# Patient Record
Sex: Female | Born: 1975 | Race: Black or African American | Hispanic: No | Smoking: Current every day smoker
Health system: Southern US, Community
[De-identification: ages and names within clinical notes are randomized; demographics above are authoritative.]

## PROBLEM LIST (undated history)

## (undated) DIAGNOSIS — E119 Type 2 diabetes mellitus without complications: Secondary | ICD-10-CM

## (undated) DIAGNOSIS — I10 Essential (primary) hypertension: Secondary | ICD-10-CM

## (undated) DIAGNOSIS — R0600 Dyspnea, unspecified: Secondary | ICD-10-CM

## (undated) DIAGNOSIS — E669 Obesity, unspecified: Secondary | ICD-10-CM

## (undated) DIAGNOSIS — G4730 Sleep apnea, unspecified: Secondary | ICD-10-CM

## (undated) HISTORY — PX: CHOLECYSTECTOMY: SHX55

## (undated) HISTORY — PX: TUBAL LIGATION: SHX77

## (undated) HISTORY — PX: OTHER SURGICAL HISTORY: SHX169

---

## 2007-01-10 ENCOUNTER — Emergency Department (HOSPITAL_COMMUNITY): Admission: EM | Admit: 2007-01-10 | Discharge: 2007-01-10 | Payer: Self-pay | Admitting: Emergency Medicine

## 2011-04-14 LAB — DIFFERENTIAL
Eosinophils Absolute: 0.1
Eosinophils Relative: 1
Lymphs Abs: 2.6
Monocytes Absolute: 0.7
Monocytes Relative: 10

## 2011-04-14 LAB — CBC
HCT: 43.1
MCV: 89.5
RBC: 4.82
WBC: 7.1

## 2015-11-11 ENCOUNTER — Encounter (HOSPITAL_COMMUNITY): Payer: Self-pay | Admitting: Emergency Medicine

## 2015-11-11 ENCOUNTER — Emergency Department (HOSPITAL_COMMUNITY)
Admission: EM | Admit: 2015-11-11 | Discharge: 2015-11-11 | Disposition: A | Discharge status: Home / Self Care | Payer: Self-pay | Attending: Emergency Medicine | Admitting: Emergency Medicine

## 2015-11-11 DIAGNOSIS — F17210 Nicotine dependence, cigarettes, uncomplicated: Secondary | ICD-10-CM | POA: Insufficient documentation

## 2015-11-11 DIAGNOSIS — Z79899 Other long term (current) drug therapy: Secondary | ICD-10-CM | POA: Insufficient documentation

## 2015-11-11 DIAGNOSIS — R070 Pain in throat: Secondary | ICD-10-CM | POA: Insufficient documentation

## 2015-11-11 DIAGNOSIS — R059 Cough, unspecified: Secondary | ICD-10-CM

## 2015-11-11 DIAGNOSIS — R079 Chest pain, unspecified: Secondary | ICD-10-CM | POA: Insufficient documentation

## 2015-11-11 DIAGNOSIS — R05 Cough: Secondary | ICD-10-CM | POA: Insufficient documentation

## 2015-11-11 DIAGNOSIS — I10 Essential (primary) hypertension: Secondary | ICD-10-CM | POA: Insufficient documentation

## 2015-11-11 HISTORY — DX: Essential (primary) hypertension: I10

## 2015-11-11 MED ORDER — DEXAMETHASONE 4 MG PO TABS
10.0000 mg | ORAL_TABLET | Freq: Once | ORAL | Status: AC
  Administered 2015-11-11: 10 mg via ORAL
  Filled 2015-11-11: qty 3

## 2015-11-11 MED ORDER — ALBUTEROL SULFATE HFA 108 (90 BASE) MCG/ACT IN AERS
2.0000 | INHALATION_SPRAY | Freq: Once | RESPIRATORY_TRACT | Status: AC
  Administered 2015-11-11: 2 via RESPIRATORY_TRACT
  Filled 2015-11-11: qty 6.7

## 2015-11-11 MED ORDER — AEROCHAMBER PLUS W/MASK MISC
1.0000 | Freq: Once | Status: AC
  Administered 2015-11-11: 1
  Filled 2015-11-11: qty 1

## 2015-11-11 NOTE — ED Notes (Signed)
Pt departed in NAD.  

## 2015-11-11 NOTE — ED Provider Notes (Signed)
CSN: [id number]     Arrival date & time 11/11/15  0403 History   First MD Initiated Contact with Patient 11/11/15 0425     Chief Complaint  Patient presents with  . Cough  . Chest Pain     (Consider location/radiation/quality/duration/timing/severity/associated sxs/prior Treatment) Patient is a 40 y.o. female presenting with cough. The history is provided by the patient.  Cough Cough characteristics:  Dry Severity:  Moderate Onset quality:  Gradual Duration:  2 weeks Timing:  Constant Progression:  Worsening Chronicity:  New Smoker: yes   Context: not upper respiratory infection   Relieved by:  Nothing Worsened by:  Nothing tried Ineffective treatments: tessalon perles. Associated symptoms: chest pain (with cough)   Associated symptoms: no fever, no myalgias, no rash, no shortness of breath and no wheezing     Past Medical History  Diagnosis Date  . Hypertension    Past Surgical History  Procedure Laterality Date  . Cholecystectomy     No family history on file. Social History  Substance Use Topics  . Smoking status: Current Every Day Smoker -- 1.00 packs/day for 9 years    Types: Cigarettes  . Smokeless tobacco: Never Used  . Alcohol Use: No   OB History    No data available     Review of Systems  Constitutional: Negative for fever.  Respiratory: Positive for cough. Negative for shortness of breath and wheezing.   Cardiovascular: Positive for chest pain (with cough).  Musculoskeletal: Negative for myalgias.  Skin: Negative for rash.  All other systems reviewed and are negative.     Allergies  Review of patient's allergies indicates no known allergies.  Home Medications   Prior to Admission medications   Medication Sig Start Date End Date Taking? Authorizing Provider  hydrochlorothiazide (HYDRODIURIL) 25 MG tablet Take 25 mg by mouth daily.   Yes Historical Provider, MD  lisinopril (PRINIVIL,ZESTRIL) 20 MG tablet Take 20 mg by mouth daily.   Yes  Historical Provider, MD  metoprolol succinate (TOPROL-XL) 25 MG 24 hr tablet Take 25 mg by mouth daily.   Yes Historical Provider, MD   BP 157/91 mmHg  Pulse 95  Temp(Src) 98.1 F (36.7 C) (Oral)  Resp 17  Ht 5\' 5"  (1.651 m)  Wt 269 lb (122.018 kg)  BMI 44.76 kg/m2  SpO2 100%  LMP 11/11/2015 (Exact Date) Physical Exam  Constitutional: She is oriented to person, place, and time. She appears well-developed and well-nourished. No distress.  HENT:  Head: Normocephalic.  Eyes: Conjunctivae are normal.  Neck: Neck supple. No tracheal deviation present.  Cardiovascular: Normal rate, regular rhythm and normal heart sounds.  Exam reveals no friction rub.   No murmur heard. Pulmonary/Chest: Effort normal. No respiratory distress. She has no wheezes. She has no rales. She exhibits no tenderness.  Abdominal: Soft. Bowel sounds are normal. She exhibits no distension.  Neurological: She is alert and oriented to person, place, and time.  Skin: Skin is warm and dry.  Psychiatric: She has a normal mood and affect.  Vitals reviewed.   ED Course  Procedures (including critical care time) Labs Review Labs Reviewed - No data to display  Imaging Review No results found. I have personally reviewed and evaluated these images and lab results as part of my medical decision-making.   EKG Interpretation   Date/Time:  Monday Nov 11 2015 04:13:15 EDT Ventricular Rate:  93 PR Interval:  155 QRS Duration: 77 QT Interval:  384 QTC Calculation: 478 R Axis:  28 Text Interpretation:  Sinus rhythm Ventricular premature complex Probable  left atrial enlargement No previous tracing Confirmed by Sandra Tellefsen MD, Jameshia Hayashida  ([id number]) on 11/11/2015 4:54:59 AM      MDM   Final diagnoses:  Cough   40 y.o. female presents with Ongoing cough for the last 1-2 weeks. The cough is dry and is causing her to now have some throat pain. No fevers, no other infectious symptoms and low suspicion of pneumonia given her  clinical picture. It is possible she has a post viral cough syndrome that is exacerbated by her ongoing smoking. I also discussed the possibility of lisinopril induced cough and I recommended supportive care measures and if cough does not improve in the next 2-3 weeks she discuss discontinuing this medication with her primary care physician. Provided albuterol for adjunctive therapy and Decadron for throat pain. Plan to follow up with PCP as needed and return precautions discussed for worsening or new concerning symptoms.     Lyndal Pulley, MD 11/11/15 [phone]

## 2015-11-11 NOTE — ED Notes (Signed)
Pt had 3 teeth pulled 3 days ago. Has been experiencing a persistent dry cough ever since, with no relief from OTC cough meds. Reports CP whenever she coughs and a sore throat beginning today 5/14.

## 2015-11-11 NOTE — Discharge Instructions (Signed)
Cough, Adult Coughing is a reflex that clears your throat and your airways. Coughing helps to heal and protect your lungs. It is normal to cough occasionally, but a cough that happens with other symptoms or lasts a long time may be a sign of a condition that needs treatment. A cough may last only 2-3 weeks (acute), or it may last longer than 8 weeks (chronic). CAUSES Coughing is commonly caused by:  Breathing in substances that irritate your lungs.  A viral or bacterial respiratory infection.  Allergies.  Asthma.  Postnasal drip.  Smoking.  Acid backing up from the stomach into the esophagus (gastroesophageal reflux).  Certain medicines.  Chronic lung problems, including COPD (or rarely, lung cancer).  Other medical conditions such as heart failure. HOME CARE INSTRUCTIONS  Pay attention to any changes in your symptoms. Take these actions to help with your discomfort:  Take medicines only as told by your health care provider.  If you were prescribed an antibiotic medicine, take it as told by your health care provider. Do not stop taking the antibiotic even if you start to feel better.  Talk with your health care provider before you take a cough suppressant medicine.  Drink enough fluid to keep your urine clear or pale yellow.  If the air is dry, use a cold steam vaporizer or humidifier in your bedroom or your home to help loosen secretions.  Avoid anything that causes you to cough at work or at home.  If your cough is worse at night, try sleeping in a semi-upright position.  Avoid cigarette smoke. If you smoke, quit smoking. If you need help quitting, ask your health care provider.  Avoid caffeine.  Avoid alcohol.  Rest as needed. SEEK MEDICAL CARE IF:   You have new symptoms.  You cough up pus.  Your cough does not get better after 2-3 weeks, or your cough gets worse.  You cannot control your cough with suppressant medicines and you are losing sleep.  You  develop pain that is getting worse or pain that is not controlled with pain medicines.  You have a fever.  You have unexplained weight loss.  You have night sweats. SEEK IMMEDIATE MEDICAL CARE IF:  You cough up blood.  You have difficulty breathing.  Your heartbeat is very fast.   This information is not intended to replace advice given to you by your health care provider. Make sure you discuss any questions you have with your health care provider.   Document Released: 12/12/2010 Document Revised: 03/06/2015 Document Reviewed: 08/22/2014 Elsevier Interactive Patient Education 2016 [employer].  [employer] Vaporizers Vaporizers may help relieve the symptoms of a cough and cold. They add moisture to the air, which helps mucus to become thinner and less sticky. This makes it easier to breathe and cough up secretions. Cool mist vaporizers do not cause serious burns like hot mist vaporizers, which may also be called steamers or humidifiers. Vaporizers have not been proven to help with colds. You should not use a vaporizer if you are allergic to mold. HOME CARE INSTRUCTIONS  Follow the package instructions for the vaporizer.  Do not use anything other than distilled water in the vaporizer.  Do not run the vaporizer all of the time. This can cause mold or bacteria to grow in the vaporizer.  Clean the vaporizer after each time it is used.  Clean and dry the vaporizer well before storing it.  Stop using the vaporizer if worsening respiratory symptoms develop.  This information is not intended to replace advice given to you by your health care provider. Make sure you discuss any questions you have with your health care provider.   Document Released: 03/12/2004 Document Revised: 06/20/2013 Document Reviewed: 11/02/2012 Elsevier Interactive Patient Education [employer]2016 Elsevier Inc.

## 2016-03-03 DIAGNOSIS — M6702 Short Achilles tendon (acquired), left ankle: Secondary | ICD-10-CM

## 2016-03-03 DIAGNOSIS — M6701 Short Achilles tendon (acquired), right ankle: Secondary | ICD-10-CM

## 2016-03-03 HISTORY — DX: Short Achilles tendon (acquired), right ankle: M67.01

## 2016-06-28 ENCOUNTER — Encounter (HOSPITAL_COMMUNITY): Payer: Self-pay | Admitting: *Deleted

## 2016-06-28 ENCOUNTER — Emergency Department (HOSPITAL_COMMUNITY)
Admission: EM | Admit: 2016-06-28 | Discharge: 2016-06-28 | Disposition: A | Discharge status: Home / Self Care | Payer: PRIVATE HEALTH INSURANCE | Attending: Emergency Medicine | Admitting: Emergency Medicine

## 2016-06-28 ENCOUNTER — Emergency Department (HOSPITAL_COMMUNITY): Payer: PRIVATE HEALTH INSURANCE

## 2016-06-28 DIAGNOSIS — F17210 Nicotine dependence, cigarettes, uncomplicated: Secondary | ICD-10-CM | POA: Diagnosis not present

## 2016-06-28 DIAGNOSIS — Z7982 Long term (current) use of aspirin: Secondary | ICD-10-CM | POA: Insufficient documentation

## 2016-06-28 DIAGNOSIS — M5410 Radiculopathy, site unspecified: Secondary | ICD-10-CM | POA: Insufficient documentation

## 2016-06-28 DIAGNOSIS — M79604 Pain in right leg: Secondary | ICD-10-CM

## 2016-06-28 DIAGNOSIS — Z79899 Other long term (current) drug therapy: Secondary | ICD-10-CM | POA: Insufficient documentation

## 2016-06-28 DIAGNOSIS — I10 Essential (primary) hypertension: Secondary | ICD-10-CM | POA: Diagnosis not present

## 2016-06-28 DIAGNOSIS — E119 Type 2 diabetes mellitus without complications: Secondary | ICD-10-CM | POA: Diagnosis not present

## 2016-06-28 DIAGNOSIS — Z91040 Latex allergy status: Secondary | ICD-10-CM | POA: Diagnosis not present

## 2016-06-28 HISTORY — DX: Obesity, unspecified: E66.9

## 2016-06-28 HISTORY — DX: Type 2 diabetes mellitus without complications: E11.9

## 2016-06-28 LAB — CBG MONITORING, ED: GLUCOSE-CAPILLARY: 101 mg/dL — AB (ref 65–99)

## 2016-06-28 MED ORDER — DEXAMETHASONE SODIUM PHOSPHATE 10 MG/ML IJ SOLN: 10.0000 mg | Freq: Once | INTRAMUSCULAR | Status: DC

## 2016-06-28 MED ORDER — IBUPROFEN 400 MG PO TABS
600.0000 mg | ORAL_TABLET | Freq: Once | ORAL | Status: AC
  Administered 2016-06-28: 600 mg via ORAL
  Filled 2016-06-28: qty 1

## 2016-06-28 MED ORDER — METHOCARBAMOL 500 MG PO TABS: 500.0000 mg | ORAL_TABLET | Freq: Every evening | ORAL | 0 refills | Status: DC | PRN

## 2016-06-28 MED ORDER — GABAPENTIN 300 MG PO CAPS
300.0000 mg | ORAL_CAPSULE | Freq: Once | ORAL | Status: AC
  Administered 2016-06-28: 300 mg via ORAL
  Filled 2016-06-28: qty 1

## 2016-06-28 MED ORDER — HYDROCODONE-ACETAMINOPHEN 5-325 MG PO TABS
2.0000 | ORAL_TABLET | ORAL | 0 refills | Status: DC | PRN
Start: 2016-06-28 — End: 2016-10-22

## 2016-06-28 MED ORDER — GABAPENTIN 300 MG PO CAPS: 300.0000 mg | ORAL_CAPSULE | Freq: Three times a day (TID) | ORAL | 0 refills | Status: AC

## 2016-06-28 MED ORDER — HYDROCODONE-ACETAMINOPHEN 5-325 MG PO TABS
1.0000 | ORAL_TABLET | Freq: Once | ORAL | Status: AC
  Administered 2016-06-28: 1 via ORAL
  Filled 2016-06-28: qty 1

## 2016-06-28 MED ORDER — GABAPENTIN 600 MG PO TABS
300.0000 mg | ORAL_TABLET | Freq: Once | ORAL | Status: DC
  Filled 2016-06-28: qty 0.5

## 2016-06-28 MED ORDER — LORAZEPAM 1 MG PO TABS
1.0000 mg | ORAL_TABLET | Freq: Once | ORAL | Status: AC
  Administered 2016-06-28: 1 mg via ORAL
  Filled 2016-06-28: qty 1

## 2016-06-28 MED ORDER — HYDROMORPHONE HCL 2 MG/ML IJ SOLN
1.0000 mg | Freq: Once | INTRAMUSCULAR | Status: AC
  Administered 2016-06-28: 1 mg via INTRAMUSCULAR
  Filled 2016-06-28: qty 1

## 2016-06-28 MED ORDER — METHOCARBAMOL 500 MG PO TABS
1000.0000 mg | ORAL_TABLET | Freq: Once | ORAL | Status: AC
  Administered 2016-06-28: 1000 mg via ORAL
  Filled 2016-06-28: qty 2

## 2016-06-28 NOTE — ED Notes (Signed)
Call received from MRI staff reporting patient is having an anxiety attack and unable to finish exam.  Provider notified.  Relayed to caller that patient was just medicated and needed to wait a little longer to try the exam.

## 2016-06-28 NOTE — ED Notes (Signed)
Unable to do MRI due to body habitus.  Provider notified.

## 2016-06-28 NOTE — ED Triage Notes (Signed)
Pt reports severe pain down back of right leg. Pt went to [facility] last night, had xrays done and was told it was probably due to sciatica. No relief with ibuprofen and heating pad. Pt is tearful, reports unable to sleep or rest. [facility] told her to return if pain did not improve for possible MRI.

## 2016-06-28 NOTE — ED Provider Notes (Signed)
[facility]-EMERGENCY DEPT Provider Note   CSN: [id number] Arrival date & time: 06/28/16  1414     History   Chief Complaint Chief Complaint  Patient presents with  . Leg Pain    HPI Jean CaddyLaytwuan Kot is a 40 y.o. female who presents with right leg pain. PMH significant for non insulin dependent DM, HTN and obesity. Husband is at bedside. She states that she has been having this pain intermittently for the past 4 months. Usually it is just in the thigh and she would attribute it to walking a lot. For the past three days she has been having constant, gradually worsening, severe, shooting pain from the posterior mid thigh to the mid calf. Sometimes the pain is dull/achy and then can be burning or sharp. It is worse with movement and certain positions. Better with lying still. She denies back pain. Denies syncope, trauma, unexplained weight loss, hx of cancer, loss of bowel/bladder function, saddle anesthesia, urinary retention, IVDU. She is ambulatory with a limp. She states she they went to [facility] last night and had Xrays which showed degenerative disc disease. She was diagnosed with sciatica and given Meloxicam and flexaril and steroids. She was told she may need a MRI is symptoms are worsening. They tried to go back to [facility] this morning since pain was not getting better with medicines however the wait was too long so they came to [facility].     HPI  Past Medical History:  Diagnosis Date  . Diabetes mellitus without complication (HCC)   . Hypertension   . Obesity     There are no active problems to display for this patient.   Past Surgical History:  Procedure Laterality Date  . CHOLECYSTECTOMY      OB History    No data available       Home Medications    Prior to Admission medications   Medication Sig Start Date End Date Taking? Authorizing Provider  acetaminophen (TYLENOL) 500 MG tablet Take 1,000 mg by mouth every 6 (six) hours as needed.   Yes Historical  Provider, MD  aspirin 81 MG chewable tablet Chew 81 mg by mouth daily.   Yes Historical Provider, MD  calcium carbonate (OS-CAL - DOSED IN MG OF ELEMENTAL CALCIUM) 1250 (500 Ca) MG tablet Take 1 tablet by mouth daily with breakfast.   Yes Historical Provider, MD  Cholecalciferol (VITAMIN D3) 50000 units CAPS Take 50,000 Units by mouth daily.   Yes Historical Provider, MD  cyclobenzaprine (FLEXERIL) 10 MG tablet Take 10 mg by mouth 3 (three) times daily as needed for muscle spasms.   Yes Historical Provider, MD  Dulaglutide (TRULICITY) 0.75 MG/0.5ML SOPN Inject 0.75 mg into the skin once a week.   Yes Historical Provider, MD  ibuprofen (ADVIL,MOTRIN) 200 MG tablet Take 400 mg by mouth every 6 (six) hours as needed for moderate pain.   Yes Historical Provider, MD  metoprolol succinate (TOPROL-XL) 25 MG 24 hr tablet Take 25 mg by mouth daily.   Yes Historical Provider, MD  Olmesartan-Amlodipine-HCTZ (TRIBENZOR PO) Take 1 tablet by mouth daily.   Yes Historical Provider, MD  potassium chloride SA (K-DUR,KLOR-CON) 20 MEQ tablet Take 20 mEq by mouth daily.   Yes Historical Provider, MD    Family History History reviewed. No pertinent family history.  Social History Social History  Substance Use Topics  . Smoking status: Current Every Day Smoker    Packs/day: 1.00    Years: 9.00    Types: Cigarettes  .  Smokeless tobacco: Never Used  . Alcohol use No     Allergies   Latex   Review of Systems Review of Systems  Constitutional: Negative for fever.  Genitourinary: Negative for difficulty urinating.  Musculoskeletal: Positive for gait problem and myalgias. Negative for arthralgias and back pain.  Skin: Negative for color change and rash.  Allergic/Immunologic: Positive for immunocompromised state (diabetic).  Neurological: Positive for numbness. Negative for weakness.  All other systems reviewed and are negative.    Physical Exam Updated Vital Signs BP (!) 197/126 (BP Location: Right  Arm) Comment: patient states she hasn't taken her bp meds today  Pulse 99   Temp 98.2 F (36.8 C) (Oral)   Resp 16   Ht 5\' 5"  (1.651 m)   Wt 127.9 kg   LMP 06/02/2016   SpO2 98%   BMI 46.93 kg/m   Physical Exam  Constitutional: She is oriented to person, place, and time. She appears well-developed and well-nourished. She appears distressed.  Obese, lying on side, in mild distress due to pain  HENT:  Head: Normocephalic and atraumatic.  Eyes: Conjunctivae are normal. Pupils are equal, round, and reactive to light. Right eye exhibits no discharge. Left eye exhibits no discharge. No scleral icterus.  Neck: Normal range of motion.  Cardiovascular: Normal rate.   Pulmonary/Chest: Effort normal. No respiratory distress.  Abdominal: She exhibits no distension.  Musculoskeletal:  Back: Inspection: No masses, deformity, or rash Palpation: No midline spinal tenderness. No paraspinal muscle tenderness. Symptoms are reproduced with palpation along the right buttocks and posterior thigh. ROM: Deferred Strength: 5/5 in lower extremities and normal plantar and dorsiflexion Sensation: Intact sensation with light touch in lower extremities bilaterally Gait: Antalgic gait Reflexes: Patellar and Achilles reflex is 2+ on the left, and 1+ on the right.   SLR: Negative seated straight leg raise   Neurological: She is alert and oriented to person, place, and time.  Skin: Skin is warm and dry.  Psychiatric: She has a normal mood and affect. Her behavior is normal.  Nursing note and vitals reviewed.    ED Treatments / Results  Labs (all labs ordered are listed, but only abnormal results are displayed) Labs Reviewed - No data to display  EKG  EKG Interpretation None       Radiology No results found.  Procedures Procedures (including critical care time)  Medications Ordered in ED Medications  ibuprofen (ADVIL,MOTRIN) tablet 600 mg (600 mg Oral Given 06/28/16 1640)  methocarbamol  (ROBAXIN) tablet 1,000 mg (1,000 mg Oral Given 06/28/16 1641)  HYDROcodone-acetaminophen (NORCO/VICODIN) 5-325 MG per tablet 1 tablet (1 tablet Oral Given 06/28/16 1640)  gabapentin (NEURONTIN) capsule 300 mg (300 mg Oral Given 06/28/16 1640)  LORazepam (ATIVAN) tablet 1 mg (1 mg Oral Given 06/28/16 1940)  HYDROmorphone (DILAUDID) injection 1 mg (1 mg Intramuscular Given 06/28/16 1940)     Initial Impression / Assessment and Plan / ED Course  I have reviewed the triage vital signs and the nursing notes.  Pertinent labs & imaging results that were available during my care of the patient were reviewed by me and considered in my medical decision making (see chart for details).  Clinical Course    40 year old female with sciatica likely due to piriformis syndrome. She is markedly hypertensive but otherwise vitals are normal. Pain meds ordered.  On recheck, patient states pain has not improved. Will order MRI to r/o emergent process.  On 2nd recheck, patient states pain has improved to 4/10. Unfortunately pt  is too big to have MRI. BP is still elevated however improved after pain management. Discussed she will likely need MRI but can follow up with neurosurgery. No groin numbness, bowel/bladder incontinence, and she is ambulatory. No back pain. Discussed reasons to return to the ED. Patient is NAD, non-toxic, with stable VS. Patient is informed of clinical course, understands medical decision making process, and agrees with plan. Opportunity for questions provided and all questions answered. Return precautions given.   Final Clinical Impressions(s) / ED Diagnoses   Final diagnoses:  Right leg pain  Radiculopathy of leg    New Prescriptions Discharge Medication List as of 06/28/2016  9:53 PM    START taking these medications   Details  gabapentin (NEURONTIN) 300 MG capsule Take 1 capsule (300 mg total) by mouth 3 (three) times daily., Starting Sun 06/28/2016, Print      HYDROcodone-acetaminophen (NORCO/VICODIN) 5-325 MG tablet Take 2 tablets by mouth every 4 (four) hours as needed., Starting Sun 06/28/2016, Print    methocarbamol (ROBAXIN) 500 MG tablet Take 1 tablet (500 mg total) by mouth at bedtime and may repeat dose one time if needed., Starting Sun 06/28/2016, Print         Bethel Born, PA-C 06/28/16 2350    Marily Memos, MD 06/29/16 1213

## 2016-06-28 NOTE — Discharge Instructions (Signed)
Take anti-inflammatory medicines (Meloxicam) for the next week. Take this medicine with food. Take Gabapentin for nerve pain Take muscle relaxer at bedtime to help you sleep. This medicine makes you drowsy so do not take before driving or work Use a heating pad for sore muscles - use for 20 minutes several times a day

## 2016-10-05 ENCOUNTER — Other Ambulatory Visit: Payer: Self-pay | Admitting: Neurosurgery

## 2016-10-20 ENCOUNTER — Encounter (HOSPITAL_COMMUNITY)
Admission: RE | Admit: 2016-10-20 | Discharge: 2016-10-20 | Disposition: A | Discharge status: Home / Self Care | Payer: Medicaid Other | Source: Ambulatory Visit | Attending: Neurosurgery | Admitting: Neurosurgery

## 2016-10-20 ENCOUNTER — Encounter (HOSPITAL_COMMUNITY): Payer: Self-pay

## 2016-10-20 DIAGNOSIS — Z01810 Encounter for preprocedural cardiovascular examination: Secondary | ICD-10-CM | POA: Insufficient documentation

## 2016-10-20 DIAGNOSIS — Z01812 Encounter for preprocedural laboratory examination: Secondary | ICD-10-CM | POA: Insufficient documentation

## 2016-10-20 DIAGNOSIS — Z01818 Encounter for other preprocedural examination: Secondary | ICD-10-CM | POA: Diagnosis present

## 2016-10-20 DIAGNOSIS — R9431 Abnormal electrocardiogram [ECG] [EKG]: Secondary | ICD-10-CM | POA: Insufficient documentation

## 2016-10-20 DIAGNOSIS — E119 Type 2 diabetes mellitus without complications: Secondary | ICD-10-CM | POA: Diagnosis not present

## 2016-10-20 DIAGNOSIS — Z3202 Encounter for pregnancy test, result negative: Secondary | ICD-10-CM | POA: Diagnosis not present

## 2016-10-20 HISTORY — DX: Sleep apnea, unspecified: G47.30

## 2016-10-20 HISTORY — DX: Dyspnea, unspecified: R06.00

## 2016-10-20 LAB — CBC
HCT: 42.6 % (ref 36.0–46.0)
Hemoglobin: 14.6 g/dL (ref 12.0–15.0)
MCH: 30.9 pg (ref 26.0–34.0)
MCHC: 34.3 g/dL (ref 30.0–36.0)
MCV: 90.1 fL (ref 78.0–100.0)
PLATELETS: 268 10*3/uL (ref 150–400)
RBC: 4.73 MIL/uL (ref 3.87–5.11)
RDW: 13.2 % (ref 11.5–15.5)
WBC: 8.1 10*3/uL (ref 4.0–10.5)

## 2016-10-20 LAB — BASIC METABOLIC PANEL
ANION GAP: 10 (ref 5–15)
BUN: 13 mg/dL (ref 6–20)
CALCIUM: 9.3 mg/dL (ref 8.9–10.3)
CO2: 26 mmol/L (ref 22–32)
Chloride: 101 mmol/L (ref 101–111)
Creatinine, Ser: 0.63 mg/dL (ref 0.44–1.00)
Glucose, Bld: 106 mg/dL — ABNORMAL HIGH (ref 65–99)
Potassium: 3.6 mmol/L (ref 3.5–5.1)
SODIUM: 137 mmol/L (ref 135–145)

## 2016-10-20 LAB — SURGICAL PCR SCREEN
MRSA, PCR: NEGATIVE
STAPHYLOCOCCUS AUREUS: NEGATIVE

## 2016-10-20 LAB — GLUCOSE, CAPILLARY: Glucose-Capillary: 96 mg/dL (ref 65–99)

## 2016-10-20 LAB — HCG, SERUM, QUALITATIVE: Preg, Serum: NEGATIVE

## 2016-10-20 NOTE — Progress Notes (Addendum)
Anesthesia Consult:  Pt is a 41 year old female scheduled for L5-S1 microdiscectomy on 10/22/2016 with Jean Henderson, M.D.  [facility] includes: HTN, DM, OSA (not on CPAP yet). Current smoker. BMI 49.  - PCP is Jean Fickle, MD.   Medications include: Amlodipine-valsartan, ASA 81 mg, exenatide, metformin, metoprolol, potassium  BP 126/90   Pulse (!) 120 Comment: notified Jean Henderson  Temp 36.7 C   Resp 20   Ht  (1.651 m)   Wt 295 lb 9.6 oz (134.1 kg)   LMP 09/10/2016   SpO2 97%   BMI 49.19 kg/m    Preoperative labs reviewed.  Glucose 106. HbA1c pending.   EKG 10/20/16: Sinus tachycardia (114 bpm). Possible LA enlargement. LVH. Inferior infarct, age undetermined  Called to see pt for tachycardia.  She denies feeling heart racing or palpitations.  Denies CP, fatigue, dizziness, LE edema. Does c/o SOB with exertion since back/leg pain began 4 months ago and she became inactive; now gets SOB with walking, never at rest.  On exam, lungs CTA B and heart rhythm regular, tachycardic.  Pt reports she was out of BP meds due to lack of insurance for ~4 months and just recently restarted them.  Also recently dx with DM; fasting glucose 90-130.  Pt reports she was tachycardic at 2 recent PCP visits in last couple of weeks, but no one commented on it to her.    I will reach out to PCP regarding EKG findings, tachycardia.   Jean Henderson, Jean Henderson [facility]/Anesthesiology Phone: [phone] 10/20/2016 4:07 PM  Addendum: Dr. Samuel Germany reviewed Jean Henderson's anesthesia note and patient's EKG. He responded, "See no contraindications to surg and gen anes." (Copy on chart.) By records, she is back on medications (including DM meds and b-blocker). Patient will get vitals on arrival. A1c came back at 6.8.   Jean Henderson [facility] Short Stay Center/Anesthesiology Phone [phone] 10/21/2016 11:51 AM

## 2016-10-20 NOTE — Progress Notes (Signed)
   10/20/16 1336  OBSTRUCTIVE SLEEP APNEA  Have you ever been diagnosed with sleep apnea through a sleep study? No (to see pulmoary dr in [place] 10/26/16)  Do you snore loudly (loud enough to be heard through closed doors)?  1  Do you often feel tired, fatigued, or sleepy during the daytime (such as falling asleep during driving or talking to someone)? 0  Has anyone observed you stop breathing during your sleep? 1  Do you have, or are you being treated for high blood pressure? 1  BMI more than 35 kg/m2? 1  Age > 50 (1-yes) 0  Neck circumference greater than:Female 16 inches or larger, Female 17inches or larger? 1 (16.5)  Female Gender (Yes=1) 0  Obstructive Sleep Apnea Score 5  Score 5 or greater  Results sent to PCP

## 2016-10-20 NOTE — Pre-Procedure Instructions (Addendum)
Jean Henderson  10/20/2016      [facility] - [place], [place] - [address] AT [facility] [address] [place] [address] Phone: [phone] Fax: [phone]  [facility] [place], [place] - [address] [place] [address] Phone: [phone] Fax: [phone]    Your procedure is scheduled on 10/22/16.  Report to [facility] Admitting at 530 A.M.  Call this number if you have problems the morning of surgery:  [phone]   Remember:  Do not eat food or drink liquids after midnight.  Take these medicines the morning of surgery with A SIP OF WATER   Gabapentin,metoprolol,venalfaxine, tylenol if needed    STOP all herbel meds, nsaids (aleve,naproxen,advil,ibuprofen) prior to surgery starting Today including all vitamins/supplements,aspirin  No metformin  ,How to Manage Your Diabetes Before and After Surgery  Why is it important to control my blood sugar before and after surgery? . Improving blood sugar levels before and after surgery helps healing and can limit problems. . A way of improving blood sugar control is eating a healthy diet by: o  Eating less sugar and carbohydrates o  Increasing activity/exercise o  Talking with your doctor about reaching your blood sugar goals . High blood sugars (greater than 180 mg/dL) can raise your risk of infections and slow your recovery, so you will need to focus on controlling your diabetes during the weeks before surgery. . Make sure that the doctor who takes care of your diabetes knows about your planned surgery including the date and location.  How do I manage my blood sugar before surgery? . Check your blood sugar at least 4 times a day, starting 2 days before surgery, to make sure that the level is not too high or low. o Check your blood sugar the morning of your surgery when you wake up and every 2 hours until you get to the Short Stay unit. . If  your blood sugar is less than 70 mg/dL, you will need to treat for low blood sugar: o Do not take insulin. o Treat a low blood sugar (less than 70 mg/dL) with  cup of clear juice (cranberry or apple), 4 glucose tablets, OR glucose gel. o Recheck blood sugar in 15 minutes after treatment (to make sure it is greater than 70 mg/dL). If your blood sugar is not greater than 70 mg/dL on recheck, call [phone] for further instructions. . Report your blood sugar to the short stay nurse when you get to Short Stay.  . If you are admitted to the hospital after surgery: o Your blood sugar will be checked by the staff and you will probably be given insulin after surgery (instead of oral diabetes medicines) to make sure you have good blood sugar levels. o The goal for blood sugar control after surgery is 80-180 mg/dL.   WHAT DO I DO ABOUT MY DIABETES MEDICATION?   Marland Kitchen Do not take oral diabetes medicines (pills) the morning of surgery(.no metformin)     Do not wear jewelry, make-up or nail polish.  Do not wear lotions, powders, or perfumes, or deoderant.  Do not shave 48 hours prior to surgery.  Men may shave face and neck.  Do not bring valuables to the hospital.  [facility] is not responsible for any belongings or valuables.  Contacts, dentures or bridgework may not be worn into surgery.  Leave your suitcase in the  car.  After surgery it may be brought to your room.  For patients admitted to the hospital, discharge time will be determined by your treatment team.  Patients discharged the day of surgery will not be allowed to drive home.   Special instructions:   Special Instructions: [facility] - Preparing for Surgery  Before surgery, you can play an important role.  Because skin is not sterile, your skin needs to be as free of germs as possible.  You can reduce the number of germs on you skin by washing with CHG (chlorahexidine gluconate) soap before surgery.  CHG is an antiseptic cleaner  which kills germs and bonds with the skin to continue killing germs even after washing.  Please DO NOT use if you have an allergy to CHG or antibacterial soaps.  If your skin becomes reddened/irritated stop using the CHG and inform your nurse when you arrive at Short Stay.  Do not shave (including legs and underarms) for at least 48 hours prior to the first CHG shower.  You may shave your face.  Please follow these instructions carefully:   1.  Shower with CHG Soap the night before surgery and the morning of Surgery.  2.  If you choose to wash your hair, wash your hair first as usual with your normal shampoo.  3.  After you shampoo, rinse your hair and body thoroughly to remove the Shampoo.  4.  Use CHG as you would any other liquid soap.  You can apply chg directly  to the skin and wash gently with scrungie or a clean washcloth.  5.  Apply the CHG Soap to your body ONLY FROM THE NECK DOWN.  Do not use on open wounds or open sores.  Avoid contact with your eyes ears, mouth and genitals (private parts).  Wash genitals (private parts)       with your normal soap.  6.  Wash thoroughly, paying special attention to the area where your surgery will be performed.  7.  Thoroughly rinse your body with warm water from the neck down.  8.  DO NOT shower/wash with your normal soap after using and rinsing off the CHG Soap.  9.  Pat yourself dry with a clean towel.            10.  Wear clean pajamas.            11.  Place clean sheets on your bed the night of your first shower and do not sleep with pets.  Day of Surgery  Do not apply any lotions/deodorants the morning of surgery.  Please wear clean clothes to the hospital/surgery center.  Please read over the fact sheets that you were given.

## 2016-10-20 NOTE — Progress Notes (Signed)
   10/20/16 1336  OBSTRUCTIVE SLEEP APNEA  Have you ever been diagnosed with sleep apnea through a sleep study? No (to see pulmoary dr in [place] 10/16/16)  Do you snore loudly (loud enough to be heard through closed doors)?  1  Do you often feel tired, fatigued, or sleepy during the daytime (such as falling asleep during driving or talking to someone)? 0  Has anyone observed you stop breathing during your sleep? 1  Do you have, or are you being treated for high blood pressure? 1  BMI more than 35 kg/m2? 1  Age > 50 (1-yes) 0  Neck circumference greater than:Female 16 inches or larger, Female 17inches or larger? 1 (16.5)  Female Gender (Yes=1) 0  Obstructive Sleep Apnea Score 5  Score 5 or greater  Results sent to PCP

## 2016-10-20 NOTE — Progress Notes (Addendum)
Called Jean Henderson diabetic coordinator,patient newly on diabetic meds,, has not had any education diabetic wise.she stated dhe would try to see patint after surgery. Consulted dr Rica Mast  About hr ,new diabetic, back on other meds for 2 weeks after none since jan 18 due to finances. Om medicaid now. req'd office notes from pcp.-on chart.

## 2016-10-21 LAB — HEMOGLOBIN A1C
HEMOGLOBIN A1C: 6.8 % — AB (ref 4.8–5.6)
MEAN PLASMA GLUCOSE: 148 mg/dL

## 2016-10-21 MED ORDER — DEXTROSE 5 % IV SOLN
3.0000 g | INTRAVENOUS | Status: AC
  Administered 2016-10-22: 3 g via INTRAVENOUS
  Filled 2016-10-21: qty 3000

## 2016-10-21 MED ORDER — DEXAMETHASONE SODIUM PHOSPHATE 10 MG/ML IJ SOLN
10.0000 mg | INTRAMUSCULAR | Status: AC
  Administered 2016-10-22: 10 mg via INTRAVENOUS
  Filled 2016-10-21: qty 1

## 2016-10-22 ENCOUNTER — Ambulatory Visit (HOSPITAL_COMMUNITY): Payer: Medicaid Other

## 2016-10-22 ENCOUNTER — Encounter (HOSPITAL_COMMUNITY)
Admission: RE | Disposition: A | Discharge status: Home / Self Care | Payer: Self-pay | Source: Ambulatory Visit | Attending: Neurosurgery

## 2016-10-22 ENCOUNTER — Ambulatory Visit (HOSPITAL_COMMUNITY): Payer: Medicaid Other | Admitting: Emergency Medicine

## 2016-10-22 ENCOUNTER — Encounter (HOSPITAL_COMMUNITY): Payer: Self-pay

## 2016-10-22 ENCOUNTER — Ambulatory Visit (HOSPITAL_COMMUNITY): Payer: Medicaid Other | Admitting: Certified Registered Nurse Anesthetist

## 2016-10-22 ENCOUNTER — Ambulatory Visit (HOSPITAL_COMMUNITY)
Admission: RE | Admit: 2016-10-22 | Discharge: 2016-10-22 | Disposition: A | Discharge status: Home / Self Care | Payer: Medicaid Other | Source: Ambulatory Visit | Attending: Neurosurgery | Admitting: Neurosurgery

## 2016-10-22 DIAGNOSIS — M5126 Other intervertebral disc displacement, lumbar region: Secondary | ICD-10-CM | POA: Diagnosis present

## 2016-10-22 DIAGNOSIS — Z7982 Long term (current) use of aspirin: Secondary | ICD-10-CM | POA: Diagnosis not present

## 2016-10-22 DIAGNOSIS — E119 Type 2 diabetes mellitus without complications: Secondary | ICD-10-CM | POA: Diagnosis not present

## 2016-10-22 DIAGNOSIS — Z7984 Long term (current) use of oral hypoglycemic drugs: Secondary | ICD-10-CM | POA: Insufficient documentation

## 2016-10-22 DIAGNOSIS — Z79899 Other long term (current) drug therapy: Secondary | ICD-10-CM | POA: Insufficient documentation

## 2016-10-22 DIAGNOSIS — F17210 Nicotine dependence, cigarettes, uncomplicated: Secondary | ICD-10-CM | POA: Insufficient documentation

## 2016-10-22 DIAGNOSIS — G4730 Sleep apnea, unspecified: Secondary | ICD-10-CM | POA: Diagnosis not present

## 2016-10-22 DIAGNOSIS — I10 Essential (primary) hypertension: Secondary | ICD-10-CM | POA: Insufficient documentation

## 2016-10-22 DIAGNOSIS — M5117 Intervertebral disc disorders with radiculopathy, lumbosacral region: Secondary | ICD-10-CM | POA: Diagnosis not present

## 2016-10-22 DIAGNOSIS — M549 Dorsalgia, unspecified: Secondary | ICD-10-CM | POA: Diagnosis present

## 2016-10-22 DIAGNOSIS — Z6841 Body Mass Index (BMI) 40.0 and over, adult: Secondary | ICD-10-CM | POA: Diagnosis not present

## 2016-10-22 DIAGNOSIS — Z419 Encounter for procedure for purposes other than remedying health state, unspecified: Secondary | ICD-10-CM

## 2016-10-22 HISTORY — PX: LUMBAR LAMINECTOMY/DECOMPRESSION MICRODISCECTOMY: SHX5026

## 2016-10-22 HISTORY — DX: Other intervertebral disc displacement, lumbar region: M51.26

## 2016-10-22 LAB — GLUCOSE, CAPILLARY
Glucose-Capillary: 114 mg/dL — ABNORMAL HIGH (ref 65–99)
Glucose-Capillary: 98 mg/dL (ref 65–99)

## 2016-10-22 SURGERY — LUMBAR LAMINECTOMY/DECOMPRESSION MICRODISCECTOMY 1 LEVEL
Anesthesia: General | Site: Spine Lumbar | Laterality: Right

## 2016-10-22 MED ORDER — VENLAFAXINE HCL 75 MG PO TABS: 75.0000 mg | ORAL_TABLET | Freq: Every day | ORAL | Status: DC

## 2016-10-22 MED ORDER — HYDROMORPHONE HCL 1 MG/ML IJ SOLN
INTRAMUSCULAR | Status: AC
  Administered 2016-10-22: 0.5 mg via INTRAVENOUS
  Filled 2016-10-22: qty 1

## 2016-10-22 MED ORDER — CYCLOBENZAPRINE HCL 10 MG PO TABS: 10.0000 mg | ORAL_TABLET | Freq: Three times a day (TID) | ORAL | 0 refills | Status: DC | PRN

## 2016-10-22 MED ORDER — METOPROLOL SUCCINATE ER 25 MG PO TB24: 50.0000 mg | ORAL_TABLET | Freq: Every day | ORAL | Status: DC

## 2016-10-22 MED ORDER — LORATADINE 10 MG PO TABS: 10.0000 mg | ORAL_TABLET | Freq: Every day | ORAL | Status: DC

## 2016-10-22 MED ORDER — 0.9 % SODIUM CHLORIDE (POUR BTL) OPTIME
TOPICAL | Status: DC | PRN
Start: 2016-10-22 — End: 2016-10-22
  Administered 2016-10-22: 1000 mL

## 2016-10-22 MED ORDER — ACETAMINOPHEN 325 MG PO TABS
ORAL_TABLET | ORAL | Status: AC
  Filled 2016-10-22: qty 2

## 2016-10-22 MED ORDER — ROCURONIUM BROMIDE 100 MG/10ML IV SOLN
INTRAVENOUS | Status: DC | PRN
  Administered 2016-10-22: 50 mg via INTRAVENOUS

## 2016-10-22 MED ORDER — MIDAZOLAM HCL 5 MG/5ML IJ SOLN
INTRAMUSCULAR | Status: DC | PRN
  Administered 2016-10-22: 2 mg via INTRAVENOUS

## 2016-10-22 MED ORDER — METFORMIN HCL 500 MG PO TABS: 500.0000 mg | ORAL_TABLET | Freq: Two times a day (BID) | ORAL | Status: DC

## 2016-10-22 MED ORDER — LACTATED RINGERS IV SOLN
INTRAVENOUS | Status: DC | PRN
  Administered 2016-10-22 (×2): via INTRAVENOUS

## 2016-10-22 MED ORDER — GLYCOPYRROLATE 0.2 MG/ML IJ SOLN
INTRAMUSCULAR | Status: DC | PRN
  Administered 2016-10-22: .6 mg via INTRAVENOUS

## 2016-10-22 MED ORDER — BUPIVACAINE HCL (PF) 0.25 % IJ SOLN
INTRAMUSCULAR | Status: DC | PRN
  Administered 2016-10-22: 10 mL

## 2016-10-22 MED ORDER — ACETAMINOPHEN 500 MG PO TABS: 1000.0000 mg | ORAL_TABLET | Freq: Four times a day (QID) | ORAL | Status: DC | PRN

## 2016-10-22 MED ORDER — HYDROMORPHONE HCL 1 MG/ML IJ SOLN: 0.5000 mg | INTRAMUSCULAR | Status: DC | PRN

## 2016-10-22 MED ORDER — METFORMIN HCL ER 500 MG PO TB24: 500.0000 mg | ORAL_TABLET | Freq: Two times a day (BID) | ORAL | Status: DC

## 2016-10-22 MED ORDER — CHLORHEXIDINE GLUCONATE CLOTH 2 % EX PADS: 6.0000 | MEDICATED_PAD | Freq: Once | CUTANEOUS | Status: DC

## 2016-10-22 MED ORDER — OXYCODONE HCL 5 MG PO TABS: 5.0000 mg | ORAL_TABLET | ORAL | 0 refills | Status: AC | PRN

## 2016-10-22 MED ORDER — OXYCODONE HCL 5 MG PO TABS
ORAL_TABLET | ORAL | Status: AC
  Filled 2016-10-22: qty 2

## 2016-10-22 MED ORDER — THROMBIN 5000 UNITS EX SOLR
CUTANEOUS | Status: AC
  Filled 2016-10-22: qty 10000

## 2016-10-22 MED ORDER — SODIUM CHLORIDE 0.9% FLUSH: 3.0000 mL | INTRAVENOUS | Status: DC | PRN

## 2016-10-22 MED ORDER — CYCLOBENZAPRINE HCL 10 MG PO TABS: 10.0000 mg | ORAL_TABLET | Freq: Three times a day (TID) | ORAL | Status: DC | PRN

## 2016-10-22 MED ORDER — PROMETHAZINE HCL 25 MG/ML IJ SOLN: 6.2500 mg | INTRAMUSCULAR | Status: DC | PRN

## 2016-10-22 MED ORDER — ONDANSETRON HCL 4 MG/2ML IJ SOLN: 4.0000 mg | Freq: Four times a day (QID) | INTRAMUSCULAR | Status: DC | PRN

## 2016-10-22 MED ORDER — OXYCODONE HCL 5 MG PO TABS
5.0000 mg | ORAL_TABLET | ORAL | Status: DC | PRN
  Administered 2016-10-22 (×2): 10 mg via ORAL
  Filled 2016-10-22: qty 2

## 2016-10-22 MED ORDER — ALBUMIN HUMAN 5 % IV SOLN
INTRAVENOUS | Status: DC | PRN
  Administered 2016-10-22: 09:00:00 via INTRAVENOUS

## 2016-10-22 MED ORDER — CEFAZOLIN SODIUM-DEXTROSE 2-4 GM/100ML-% IV SOLN: 2.0000 g | Freq: Three times a day (TID) | INTRAVENOUS | Status: DC

## 2016-10-22 MED ORDER — CYCLOBENZAPRINE HCL 10 MG PO TABS
10.0000 mg | ORAL_TABLET | Freq: Three times a day (TID) | ORAL | Status: DC | PRN
  Administered 2016-10-22: 10 mg via ORAL
  Filled 2016-10-22: qty 1

## 2016-10-22 MED ORDER — EXENATIDE ER 2 MG ~~LOC~~ PEN: 2.0000 mg | PEN_INJECTOR | SUBCUTANEOUS | Status: DC

## 2016-10-22 MED ORDER — OXYCODONE HCL 5 MG/5ML PO SOLN: 5.0000 mg | Freq: Once | ORAL | Status: DC | PRN

## 2016-10-22 MED ORDER — VITAMIN B-12 1000 MCG PO TABS
2000.0000 ug | ORAL_TABLET | Freq: Every day | ORAL | Status: DC
  Filled 2016-10-22: qty 2

## 2016-10-22 MED ORDER — MENTHOL 3 MG MT LOZG
1.0000 | LOZENGE | OROMUCOSAL | Status: DC | PRN
Start: 2016-10-22 — End: 2016-10-22

## 2016-10-22 MED ORDER — GABAPENTIN 300 MG PO CAPS
600.0000 mg | ORAL_CAPSULE | Freq: Three times a day (TID) | ORAL | Status: DC
  Administered 2016-10-22: 600 mg via ORAL
  Filled 2016-10-22: qty 2

## 2016-10-22 MED ORDER — IBUPROFEN 200 MG PO TABS: 800.0000 mg | ORAL_TABLET | Freq: Four times a day (QID) | ORAL | Status: DC | PRN

## 2016-10-22 MED ORDER — HYDROMORPHONE HCL 1 MG/ML IJ SOLN
0.2500 mg | INTRAMUSCULAR | Status: DC | PRN
  Administered 2016-10-22 (×4): 0.5 mg via INTRAVENOUS

## 2016-10-22 MED ORDER — ONDANSETRON HCL 4 MG/2ML IJ SOLN
INTRAMUSCULAR | Status: DC | PRN
  Administered 2016-10-22: 4 mg via INTRAVENOUS

## 2016-10-22 MED ORDER — PROPOFOL 10 MG/ML IV BOLUS
INTRAVENOUS | Status: AC
  Filled 2016-10-22: qty 40

## 2016-10-22 MED ORDER — FENTANYL CITRATE (PF) 250 MCG/5ML IJ SOLN
INTRAMUSCULAR | Status: AC
  Filled 2016-10-22: qty 5

## 2016-10-22 MED ORDER — THROMBIN 5000 UNITS EX SOLR
CUTANEOUS | Status: DC | PRN
Start: 2016-10-22 — End: 2016-10-22
  Administered 2016-10-22 (×2): 5000 [IU] via TOPICAL

## 2016-10-22 MED ORDER — MIDAZOLAM HCL 2 MG/2ML IJ SOLN
INTRAMUSCULAR | Status: AC
  Filled 2016-10-22: qty 2

## 2016-10-22 MED ORDER — PHENYLEPHRINE HCL 10 MG/ML IJ SOLN
INTRAMUSCULAR | Status: DC | PRN
  Administered 2016-10-22: 25 ug/min via INTRAVENOUS

## 2016-10-22 MED ORDER — PROPOFOL 10 MG/ML IV BOLUS
INTRAVENOUS | Status: DC | PRN
  Administered 2016-10-22: 200 mg via INTRAVENOUS

## 2016-10-22 MED ORDER — PHENOL 1.4 % MT LIQD: 1.0000 | OROMUCOSAL | Status: DC | PRN

## 2016-10-22 MED ORDER — PHENYLEPHRINE HCL 10 MG/ML IJ SOLN
INTRAMUSCULAR | Status: DC | PRN
  Administered 2016-10-22 (×4): 80 ug via INTRAVENOUS

## 2016-10-22 MED ORDER — MEPERIDINE HCL 25 MG/ML IJ SOLN: 6.2500 mg | INTRAMUSCULAR | Status: DC | PRN

## 2016-10-22 MED ORDER — BUPIVACAINE HCL (PF) 0.25 % IJ SOLN
INTRAMUSCULAR | Status: AC
Start: 2016-10-22 — End: ?
  Filled 2016-10-22: qty 30

## 2016-10-22 MED ORDER — ACETAMINOPHEN 325 MG PO TABS
650.0000 mg | ORAL_TABLET | ORAL | Status: DC | PRN
  Administered 2016-10-22: 650 mg via ORAL

## 2016-10-22 MED ORDER — PANTOPRAZOLE SODIUM 40 MG IV SOLR: 40.0000 mg | Freq: Every day | INTRAVENOUS | Status: DC

## 2016-10-22 MED ORDER — EPHEDRINE SULFATE 50 MG/ML IJ SOLN
INTRAMUSCULAR | Status: DC | PRN
  Administered 2016-10-22: 10 mg via INTRAVENOUS

## 2016-10-22 MED ORDER — ARTIFICIAL TEARS OPHTHALMIC OINT
TOPICAL_OINTMENT | OPHTHALMIC | Status: DC | PRN
  Administered 2016-10-22: 1 via OPHTHALMIC

## 2016-10-22 MED ORDER — ASPIRIN 81 MG PO CHEW: 81.0000 mg | CHEWABLE_TABLET | Freq: Every day | ORAL | Status: DC

## 2016-10-22 MED ORDER — VITAMIN D (ERGOCALCIFEROL) 1.25 MG (50000 UNIT) PO CAPS: 50000.0000 [IU] | ORAL_CAPSULE | ORAL | Status: DC

## 2016-10-22 MED ORDER — AMLODIPINE BESYLATE-VALSARTAN 10-320 MG PO TABS: 1.0000 | ORAL_TABLET | Freq: Every day | ORAL | Status: DC

## 2016-10-22 MED ORDER — AMLODIPINE BESYLATE 10 MG PO TABS
10.0000 mg | ORAL_TABLET | Freq: Every day | ORAL | Status: DC
  Filled 2016-10-22: qty 1

## 2016-10-22 MED ORDER — CALCIUM CARBONATE 1250 (500 CA) MG PO TABS
1.0000 | ORAL_TABLET | Freq: Every day | ORAL | Status: DC
  Filled 2016-10-22 (×2): qty 1

## 2016-10-22 MED ORDER — FENTANYL CITRATE (PF) 100 MCG/2ML IJ SOLN
INTRAMUSCULAR | Status: DC | PRN
  Administered 2016-10-22 (×3): 50 ug via INTRAVENOUS
  Administered 2016-10-22: 25 ug via INTRAVENOUS

## 2016-10-22 MED ORDER — LIDOCAINE HCL (CARDIAC) 20 MG/ML IV SOLN
INTRAVENOUS | Status: DC | PRN
  Administered 2016-10-22: 100 mg via INTRAVENOUS

## 2016-10-22 MED ORDER — IRBESARTAN 300 MG PO TABS
300.0000 mg | ORAL_TABLET | Freq: Every day | ORAL | Status: DC
  Filled 2016-10-22: qty 1

## 2016-10-22 MED ORDER — LIDOCAINE-EPINEPHRINE 1 %-1:100000 IJ SOLN
INTRAMUSCULAR | Status: AC
  Filled 2016-10-22: qty 1

## 2016-10-22 MED ORDER — ACETAMINOPHEN 650 MG RE SUPP: 650.0000 mg | RECTAL | Status: DC | PRN

## 2016-10-22 MED ORDER — ONDANSETRON HCL 4 MG PO TABS: 4.0000 mg | ORAL_TABLET | Freq: Four times a day (QID) | ORAL | Status: DC | PRN

## 2016-10-22 MED ORDER — LIDOCAINE-EPINEPHRINE 1 %-1:100000 IJ SOLN
INTRAMUSCULAR | Status: DC | PRN
  Administered 2016-10-22: 10 mL

## 2016-10-22 MED ORDER — ALUM & MAG HYDROXIDE-SIMETH 200-200-20 MG/5ML PO SUSP: 30.0000 mL | Freq: Four times a day (QID) | ORAL | Status: DC | PRN

## 2016-10-22 MED ORDER — POTASSIUM CHLORIDE CRYS ER 10 MEQ PO TBCR: 10.0000 meq | EXTENDED_RELEASE_TABLET | Freq: Every day | ORAL | Status: DC

## 2016-10-22 MED ORDER — SODIUM CHLORIDE 0.9% FLUSH: 3.0000 mL | Freq: Two times a day (BID) | INTRAVENOUS | Status: DC

## 2016-10-22 MED ORDER — BACITRACIN 50000 UNITS IM SOLR
INTRAMUSCULAR | Status: DC | PRN
  Administered 2016-10-22: 08:00:00

## 2016-10-22 MED ORDER — OXYCODONE HCL 5 MG PO TABS: 5.0000 mg | ORAL_TABLET | Freq: Once | ORAL | Status: DC | PRN

## 2016-10-22 MED ORDER — NEOSTIGMINE METHYLSULFATE 10 MG/10ML IV SOLN
INTRAVENOUS | Status: DC | PRN
  Administered 2016-10-22: 4 mg via INTRAVENOUS

## 2016-10-22 SURGICAL SUPPLY — 49 items
BAG DECANTER FOR FLEXI CONT (MISCELLANEOUS) ×2 IMPLANT
BENZOIN TINCTURE PRP APPL 2/3 (GAUZE/BANDAGES/DRESSINGS) ×2 IMPLANT
BLADE CLIPPER SURG (BLADE) IMPLANT
BLADE SURG 11 STRL SS (BLADE) ×2 IMPLANT
BUR CUTTER 7.0 ROUND (BURR) ×2 IMPLANT
BUR MATCHSTICK NEURO 3.0 LAGG (BURR) ×2 IMPLANT
CANISTER SUCT 3000ML PPV (MISCELLANEOUS) ×2 IMPLANT
CARTRIDGE OIL MAESTRO DRILL (MISCELLANEOUS) ×1 IMPLANT
DERMABOND ADVANCED (GAUZE/BANDAGES/DRESSINGS) ×1
DERMABOND ADVANCED .7 DNX12 (GAUZE/BANDAGES/DRESSINGS) ×1 IMPLANT
DIFFUSER DRILL AIR PNEUMATIC (MISCELLANEOUS) ×2 IMPLANT
DRAPE HALF SHEET 40X57 (DRAPES) ×2 IMPLANT
DRAPE LAPAROTOMY 100X72X124 (DRAPES) ×2 IMPLANT
DRAPE MICROSCOPE LEICA (MISCELLANEOUS) ×2 IMPLANT
DRAPE POUCH INSTRU U-SHP 10X18 (DRAPES) ×2 IMPLANT
DRAPE SURG 17X23 STRL (DRAPES) ×2 IMPLANT
DRSG OPSITE POSTOP 4X6 (GAUZE/BANDAGES/DRESSINGS) ×2 IMPLANT
DURAPREP 26ML APPLICATOR (WOUND CARE) ×2 IMPLANT
ELECT REM PT RETURN 9FT ADLT (ELECTROSURGICAL) ×2
ELECTRODE REM PT RTRN 9FT ADLT (ELECTROSURGICAL) ×1 IMPLANT
GAUZE SPONGE 4X4 12PLY STRL (GAUZE/BANDAGES/DRESSINGS) ×2 IMPLANT
GAUZE SPONGE 4X4 16PLY XRAY LF (GAUZE/BANDAGES/DRESSINGS) IMPLANT
GLOVE BIOGEL PI IND STRL 7.0 (GLOVE) ×1 IMPLANT
GLOVE BIOGEL PI INDICATOR 7.0 (GLOVE) ×1
GLOVE SS N UNI LF 6.5 STRL (GLOVE) ×2 IMPLANT
GLOVE SS N UNI LF 7.0 STRL (GLOVE) ×2 IMPLANT
GLOVE SS N UNI LF 8.0 STRL (GLOVE) ×2 IMPLANT
GOWN STRL REUS W/ TWL LRG LVL3 (GOWN DISPOSABLE) ×1 IMPLANT
GOWN STRL REUS W/ TWL XL LVL3 (GOWN DISPOSABLE) ×1 IMPLANT
GOWN STRL REUS W/TWL 2XL LVL3 (GOWN DISPOSABLE) IMPLANT
GOWN STRL REUS W/TWL LRG LVL3 (GOWN DISPOSABLE) ×1
GOWN STRL REUS W/TWL XL LVL3 (GOWN DISPOSABLE) ×1
KIT BASIN OR (CUSTOM PROCEDURE TRAY) ×2 IMPLANT
KIT ROOM TURNOVER OR (KITS) ×2 IMPLANT
NEEDLE HYPO 22GX1.5 SAFETY (NEEDLE) ×2 IMPLANT
NEEDLE SPNL 22GX3.5 QUINCKE BK (NEEDLE) ×2 IMPLANT
NS IRRIG 1000ML POUR BTL (IV SOLUTION) ×2 IMPLANT
OIL CARTRIDGE MAESTRO DRILL (MISCELLANEOUS) ×2
PACK LAMINECTOMY NEURO (CUSTOM PROCEDURE TRAY) ×2 IMPLANT
RUBBERBAND STERILE (MISCELLANEOUS) ×4 IMPLANT
SPONGE SURGIFOAM ABS GEL SZ50 (HEMOSTASIS) ×2 IMPLANT
STRIP CLOSURE SKIN 1/2X4 (GAUZE/BANDAGES/DRESSINGS) ×2 IMPLANT
SUT VIC AB 0 CT1 18XCR BRD8 (SUTURE) ×1 IMPLANT
SUT VIC AB 0 CT1 8-18 (SUTURE) ×1
SUT VIC AB 2-0 CT1 18 (SUTURE) ×2 IMPLANT
SUT VICRYL 4-0 PS2 18IN ABS (SUTURE) ×2 IMPLANT
TOWEL GREEN STERILE (TOWEL DISPOSABLE) ×2 IMPLANT
TOWEL GREEN STERILE FF (TOWEL DISPOSABLE) ×2 IMPLANT
WATER STERILE IRR 1000ML POUR (IV SOLUTION) ×2 IMPLANT

## 2016-10-22 NOTE — Anesthesia Postprocedure Evaluation (Signed)
Anesthesia Post Note  Patient: Barrister's clerk  Procedure(s) Performed: Procedure(s) (LRB): Right Lumbar Five-Sacral One Microdiscectomy (Right)  Patient location during evaluation: PACU Anesthesia Type: General Level of consciousness: awake and alert Pain management: pain level controlled Vital Signs Assessment: post-procedure vital signs reviewed and stable Respiratory status: spontaneous breathing, nonlabored ventilation, respiratory function stable and patient connected to nasal cannula oxygen Cardiovascular status: blood pressure returned to baseline and stable Postop Assessment: no signs of nausea or vomiting Anesthetic complications: no       Last Vitals:  Vitals:   10/22/16 1015 10/22/16 1030  BP: 119/86   Pulse: 91 92  Resp: 18 19  Temp:      Last Pain:  Vitals:   10/22/16 0953  TempSrc:   PainSc: 7                  Lowella Curb

## 2016-10-22 NOTE — Transfer of Care (Signed)
Immediate Anesthesia Transfer of Care Note  Patient: Jean Henderson  Procedure(s) Performed: Procedure(s): Right Lumbar Five-Sacral One Microdiscectomy (Right)  Patient Location: PACU  Anesthesia Type:General  Level of Consciousness: awake, alert  and oriented  Airway & Oxygen Therapy: Patient Spontanous Breathing and Patient connected to nasal cannula oxygen  Post-op Assessment: Report given to RN and Post -op Vital signs reviewed and stable  Post vital signs: Reviewed and stable  Last Vitals:  Vitals:   10/22/16 0617 10/22/16 0932  BP: (!) 141/106 124/81  Pulse:  (!) 110  Resp:  16  Temp:  36.6 C    Last Pain:  Vitals:   10/22/16 0649  TempSrc:   PainSc: 6       Patients Stated Pain Goal: 8 (10/22/16 0649)  Complications: No apparent anesthesia complications

## 2016-10-22 NOTE — Discharge Summary (Signed)
Physician Discharge Summary  Patient ID: Jean Henderson MRN: [id number] DOB/AGE: Jan 04, 1976 41 y.o.  Admit date: 10/22/2016 Discharge date: 10/22/2016  Admission Diagnoses:Right S1 radiculopathy herniated nuclear stenosis L5-S1 right  Discharge Diagnoses: Samegood Active Problems:   HNP (herniated nucleus pulposus), lumbar   Discharged Condition: good  Hospital Course: Patient admitted hospital underwent right-sided L5-S1 laminectomy microdiscectomy postoperatively patient did very well with recovered in the floor on the floor was angling and voiding spontaneously tolerating rigid diet stable for discharge home.  Consults: Significant Diagnostic Studies: Treatments: Right-sided L5-S1 laminectomy microdiscectomy Discharge Exam: Blood pressure (!) 139/92, pulse 100, temperature 98.2 F (36.8 C), resp. rate 18, height  (1.651 m), weight 133.8 kg (295 lb), SpO2 95 %. Strength out of 5 wound clean dry and intact  Disposition: Home   Allergies as of 10/22/2016      Reactions   Latex Itching, Rash      Medication List    STOP taking these medications   HYDROcodone-acetaminophen 5-325 MG tablet Commonly known as:  NORCO/VICODIN   methocarbamol 500 MG tablet Commonly known as:  ROBAXIN     TAKE these medications   acetaminophen 500 MG tablet Commonly known as:  TYLENOL Take 1,000 mg by mouth every 6 (six) hours as needed for moderate pain or headache.   amLODipine-valsartan 10-320 MG tablet Commonly known as:  EXFORGE Take 1 tablet by mouth daily.   aspirin 81 MG chewable tablet Chew 81 mg by mouth daily.   BYDUREON 2 MG Pen Generic drug:  Exenatide ER Inject 2 mg into the skin every Saturday.   calcium carbonate 1250 (500 Ca) MG tablet Commonly known as:  OS-CAL - dosed in mg of elemental calcium Take 1 tablet by mouth daily with breakfast.   clotrimazole-betamethasone cream Commonly known as:  LOTRISONE Apply 1 application topically 2 (two) times  daily.   cyclobenzaprine 10 MG tablet Commonly known as:  FLEXERIL Take 10 mg by mouth 3 (three) times daily as needed for muscle spasms. What changed:  Another medication with the same name was added. Make sure you understand how and when to take each.   cyclobenzaprine 10 MG tablet Commonly known as:  FLEXERIL Take 1 tablet (10 mg total) by mouth 3 (three) times daily as needed for muscle spasms. What changed:  You were already taking a medication with the same name, and this prescription was added. Make sure you understand how and when to take each.   gabapentin 300 MG capsule Commonly known as:  NEURONTIN Take 1 capsule (300 mg total) by mouth 3 (three) times daily. What changed:  how much to take   ibuprofen 200 MG tablet Commonly known as:  ADVIL,MOTRIN Take 800 mg by mouth every 6 (six) hours as needed for moderate pain.   loratadine 10 MG tablet Commonly known as:  CLARITIN Take 10 mg by mouth daily.   metFORMIN 500 MG tablet Commonly known as:  GLUCOPHAGE Take 500 mg by mouth 2 (two) times daily with a meal.   metoprolol succinate 50 MG 24 hr tablet Commonly known as:  TOPROL-XL Take 50 mg by mouth daily.   oxyCODONE 5 MG immediate release tablet Commonly known as:  Oxy IR/ROXICODONE Take 1-2 tablets (5-10 mg total) by mouth every 3 (three) hours as needed for breakthrough pain.   potassium chloride 10 MEQ tablet Commonly known as:  K-DUR,KLOR-CON Take 10 mEq by mouth daily.   venlafaxine 75 MG tablet Commonly known as:  EFFEXOR Take 75 mg by  mouth daily.   vitamin B-12 1000 MCG tablet Commonly known as:  CYANOCOBALAMIN Take 2,000 mcg by mouth daily.   Vitamin D (Ergocalciferol) 50000 units Caps capsule Commonly known as:  DRISDOL Take 50,000 Units by mouth every Saturday.      Follow-up Information    Zahira Brummond P, MD Follow up in 2 day(s).   Specialty:  Neurosurgery Contact information: [address] [place]  [address] [phone]           Signed: Mariam Dollar 10/22/2016, 2:45 PM

## 2016-10-22 NOTE — Anesthesia Procedure Notes (Signed)
Procedure Name: Intubation Date/Time: 10/22/2016 7:43 AM Performed by: Clearnce Sorrel Pre-anesthesia Checklist: Patient identified, Emergency Drugs available, Suction available, Patient being monitored and Timeout performed Patient Re-evaluated:Patient Re-evaluated prior to inductionOxygen Delivery Method: Circle system utilized Preoxygenation: Pre-oxygenation with 100% oxygen Intubation Type: IV induction Ventilation: Mask ventilation without difficulty and Two handed mask ventilation required Laryngoscope Size: Mac and 3 Grade View: Grade I Tube size: 7.0 mm Number of attempts: 1 Airway Equipment and Method: Stylet Placement Confirmation: ETT inserted through vocal cords under direct vision,  positive ETCO2 and breath sounds checked- equal and bilateral Secured at: 21 cm Tube secured with: Tape Dental Injury: Teeth and Oropharynx as per pre-operative assessment

## 2016-10-22 NOTE — Discharge Instructions (Signed)

## 2016-10-22 NOTE — Progress Notes (Signed)
Patient alert and oriented, mae's well, voiding adequate amount of urine, swallowing without difficulty, no c/o pain. Patient discharged home with family. Script and discharged instructions given to patient. Patient and family stated understanding of d/c instructions given and has an appointment with Dr. Wynetta Emery

## 2016-10-22 NOTE — H&P (Signed)
Jean Henderson is an 41 y.o. female.   Chief Complaint: back and right leg pain HPI: long standing back and right leg pain consistant with an S1 nerve radiculopathy.  Workup revelaed a large disk herniation at L5-S1.  Patient failed all forms of conservative treatment so I recommended microdiskectomy.  I extensively reviewed the risks and benfits as well as periperative course expectations of outcome and alternatives she understood and agree to proceed forward.  Past Medical History:  Diagnosis Date  . Diabetes mellitus without complication (HCC)   . Dyspnea    with exersion  . Hypertension   . Obesity   . Sleep apnea    to see pulmonary dr 10/26/16 re sleep apnea    Past Surgical History:  Procedure Laterality Date  . CESAREAN SECTION     x2  . CHOLECYSTECTOMY    . TUBAL LIGATION      History reviewed. No pertinent family history. Social History:  reports that she has been smoking Cigarettes.  She has a 9.00 pack-year smoking history. She has never used smokeless tobacco. She reports that she does not drink alcohol or use drugs.  Allergies:  Allergies  Allergen Reactions  . Latex Itching and Rash    Medications Prior to Admission  Medication Sig Dispense Refill  . acetaminophen (TYLENOL) 500 MG tablet Take 1,000 mg by mouth every 6 (six) hours as needed for moderate pain or headache.    Marland Kitchen amLODipine-valsartan (EXFORGE) 10-320 MG tablet Take 1 tablet by mouth daily.    Marland Kitchen aspirin 81 MG chewable tablet Chew 81 mg by mouth daily.    . calcium carbonate (OS-CAL - DOSED IN MG OF ELEMENTAL CALCIUM) 1250 (500 Ca) MG tablet Take 1 tablet by mouth daily with breakfast.    . clotrimazole-betamethasone (LOTRISONE) cream Apply 1 application topically 2 (two) times daily.    . cyclobenzaprine (FLEXERIL) 10 MG tablet Take 10 mg by mouth 3 (three) times daily as needed for muscle spasms.    . Exenatide ER (BYDUREON) 2 MG PEN Inject 2 mg into the skin every Saturday.    . gabapentin  (NEURONTIN) 300 MG capsule Take 1 capsule (300 mg total) by mouth 3 (three) times daily. (Patient taking differently: Take 600 mg by mouth 3 (three) times daily. ) 90 capsule 0  . ibuprofen (ADVIL,MOTRIN) 200 MG tablet Take 800 mg by mouth every 6 (six) hours as needed for moderate pain.     Marland Kitchen loratadine (CLARITIN) 10 MG tablet Take 10 mg by mouth daily.    . metFORMIN (GLUCOPHAGE-XR) 500 MG 24 hr tablet Take 500 mg by mouth 2 (two) times daily.    . metoprolol succinate (TOPROL-XL) 50 MG 24 hr tablet Take 50 mg by mouth daily.     . potassium chloride (K-DUR,KLOR-CON) 10 MEQ tablet Take 10 mEq by mouth daily.     Marland Kitchen venlafaxine (EFFEXOR) 75 MG tablet Take 75 mg by mouth daily.    . vitamin B-12 (CYANOCOBALAMIN) 1000 MCG tablet Take 2,000 mcg by mouth daily.    . Vitamin D, Ergocalciferol, (DRISDOL) 50000 units CAPS capsule Take 50,000 Units by mouth every Saturday.    Marland Kitchen HYDROcodone-acetaminophen (NORCO/VICODIN) 5-325 MG tablet Take 2 tablets by mouth every 4 (four) hours as needed. (Patient not taking: Reported on 10/20/2016) 10 tablet 0  . methocarbamol (ROBAXIN) 500 MG tablet Take 1 tablet (500 mg total) by mouth at bedtime and may repeat dose one time if needed. (Patient not taking: Reported on 10/20/2016) 10 tablet  0    Results for orders placed or performed during the hospital encounter of 10/22/16 (from the past 48 hour(s))  Glucose, capillary     Status: Abnormal   Collection Time: 10/22/16  6:20 AM  Result Value Ref Range   Glucose-Capillary 114 (H) 65 - 99 mg/dL   Comment 1 Notify RN    No results found.  Review of Systems  Musculoskeletal: Positive for back pain, joint pain and myalgias.  Neurological: Positive for tingling and sensory change.  All other systems reviewed and are negative.   Blood pressure (!) 141/106, pulse 97, temperature 98 F (36.7 C), temperature source Oral, resp. rate 20, height  (1.651 m), weight 133.8 kg (295 lb), SpO2 96 %. Physical Exam   Constitutional: She is oriented to person, place, and time. She appears well-developed and well-nourished.  HENT:  Head: Normocephalic.  Eyes: Pupils are equal, round, and reactive to light.  Neck: Normal range of motion.  GI: Soft. Bowel sounds are normal.  Neurological: She is alert and oriented to person, place, and time. She has normal strength. GCS eye subscore is 4. GCS verbal subscore is 5. GCS motor subscore is 6.  Strength is 5/5 lower extremities bilaterally     Assessment/Plan 41 yo with L5-S1 HNP presents for microdiskectomy  Daimion Adamcik P, MD 10/22/2016, 7:09 AM

## 2016-10-22 NOTE — Anesthesia Preprocedure Evaluation (Addendum)
Anesthesia Evaluation  Patient identified by MRN, date of birth, ID band Patient awake    Reviewed: Allergy & Precautions, NPO status , Patient's Chart, lab work & pertinent test results, reviewed documented beta blocker date and time   Airway Mallampati: II  TM Distance: >3 FB Neck ROM: Full    Dental no notable dental hx. (+) Missing, Dental Advisory Given, Poor Dentition   Pulmonary neg pulmonary ROS, sleep apnea , Current Smoker,    Pulmonary exam normal breath sounds clear to auscultation       Cardiovascular hypertension, negative cardio ROS Normal cardiovascular exam Rhythm:Regular Rate:Normal     Neuro/Psych negative neurological ROS  negative psych ROS   GI/Hepatic negative GI ROS, Neg liver ROS,   Endo/Other  negative endocrine ROSdiabetes, Type 2, Oral Hypoglycemic AgentsMorbid obesity  Renal/GU negative Renal ROS  negative genitourinary   Musculoskeletal negative musculoskeletal ROS (+)   Abdominal (+) + obese,   Peds negative pediatric ROS (+)  Hematology negative hematology ROS (+)   Anesthesia Other Findings   Reproductive/Obstetrics negative OB ROS                            Anesthesia Physical Anesthesia Plan  ASA: III  Anesthesia Plan: General   Post-op Pain Management:    Induction: Intravenous  Airway Management Planned: Oral ETT  Additional Equipment:   Intra-op Plan:   Post-operative Plan: Extubation in OR  Informed Consent: I have reviewed the patients History and Physical, chart, labs and discussed the procedure including the risks, benefits and alternatives for the proposed anesthesia with the patient or authorized representative who has indicated his/her understanding and acceptance.   Dental advisory given  Plan Discussed with: CRNA  Anesthesia Plan Comments:         Anesthesia Quick Evaluation

## 2016-10-23 ENCOUNTER — Encounter (HOSPITAL_COMMUNITY): Payer: Self-pay | Admitting: Neurosurgery

## 2016-10-23 NOTE — Op Note (Signed)
Preoperative diagnosis:  Right-sided S1 radiculopathy from herniated nucleus Pulposus L5-S1 right   postoperative diagnosis:  Same  Procedure:  Right-sided lumbar laminectomy microdiskectomy at L5-S1 with microdissection of the right S1 nerve root microscopic diskectomy  Surgeon: Donalee Citrin   Anesthesia:  General  EBL: Minimal  HPI:  Patient is a very pleasant 4 41 year old female whose of progressive worsening back and right leg pain.  Workup revealed a very large disc herniation L5-S1 on the right.  Due to patient's failed conservative treatment imaging findings and progression clinical syndrome I recommended laminectomy microdiskectomy at this level.  I extensively went over the risks and benefits of the operation with the patient as well as perioperative course expectations of outcome and alternatives of surgery and she understood and agreed to proceed forward.  Operative procedure:  Patient was brought into the OR was induced under general anesthesia position prone on Wilson frame her back was prepped and draped in routine sterile fashion.  Preoperative x-ray to localize the appropriate level so after infiltration of 10 cc lidocaine with epi and a midline incision was made and Bovie  Electric cautery was used to dissect through the subcutaneous tissue and subperiosteal dissections care lamina of L5 and S1 on the right. Intraoperative x-ray confirmed identification appropriate level.  So then a high-speed drill was used to drill down the inferior aspect of the lamina of L5 medial facet complex and  superior aspect of the lamina of S1.  Laminotomy was then begun with a 3 mm Kerrison punch removing ligamentum flavum which was noted to be markedly hypertrophied exposing the thecal sac and S1 nerve root.  Then under microscopic illumination the S1 nerve root was dissected off of a large disc herniation still partially contained within the ligament.  A large free fragment was removed and then the disc  space was identified which was noted still to be causing some ventral compression however the disc was partially calcified at this level and sizes much today could push down as much of the calcified tied disc as we could with an Epstein and cleaned out the disc space with pituitary rongeur was.  At no diskectomy there was no further stenosis on the S1 nerve root or the thecal sac wound was then copiously irrigated radicular seems stasis was maintain Gel-Foam was overlaid top of the dura and the muscle fascia reapproximated layers with interrupted Vicryl and skin was closed with running for subcuticular.  Dermabond benzoin Steri-Strips and sterile dressing was applied patient recovery room in stable condition.  At the end the case all needle counts sponge counts were correct.

## 2016-12-08 DIAGNOSIS — F332 Major depressive disorder, recurrent severe without psychotic features: Secondary | ICD-10-CM

## 2016-12-08 DIAGNOSIS — F411 Generalized anxiety disorder: Secondary | ICD-10-CM

## 2016-12-08 HISTORY — DX: Major depressive disorder, recurrent severe without psychotic features: F33.2

## 2016-12-08 HISTORY — DX: Generalized anxiety disorder: F41.1

## 2017-04-20 DIAGNOSIS — E119 Type 2 diabetes mellitus without complications: Secondary | ICD-10-CM | POA: Insufficient documentation

## 2017-04-20 DIAGNOSIS — L600 Ingrowing nail: Secondary | ICD-10-CM | POA: Insufficient documentation

## 2017-04-20 HISTORY — DX: Ingrowing nail: L60.0

## 2017-05-03 DIAGNOSIS — N914 Secondary oligomenorrhea: Secondary | ICD-10-CM | POA: Insufficient documentation

## 2017-05-03 HISTORY — DX: Secondary oligomenorrhea: N91.4

## 2018-02-01 ENCOUNTER — Other Ambulatory Visit: Payer: Self-pay | Admitting: Physician Assistant

## 2018-02-01 DIAGNOSIS — M5416 Radiculopathy, lumbar region: Secondary | ICD-10-CM

## 2018-02-22 ENCOUNTER — Ambulatory Visit
Admission: RE | Admit: 2018-02-22 | Discharge: 2018-02-22 | Disposition: A | Discharge status: Home / Self Care | Payer: Medicaid Other | Source: Ambulatory Visit | Attending: Physician Assistant | Admitting: Physician Assistant

## 2018-02-22 DIAGNOSIS — M5416 Radiculopathy, lumbar region: Secondary | ICD-10-CM

## 2019-06-05 IMAGING — MR MR LUMBAR SPINE W/O CM
4 of 5 series · 27 of 48 positions shown · non-contrast
Comparison: Lumbar MRI 11/05/2016

CLINICAL DATA: Lumbar radiculopathy.  Laminectomy L5-S1 10/22/2016

EXAM:
MRI LUMBAR SPINE WITHOUT CONTRAST
TECHNIQUE: Multiplanar, multisequence MR imaging of the lumbar spine was
performed. No intravenous contrast was administered.

[Series 3: T2 post-contrast · sagittal · 4.0mm · 0.55mm/px · 6 of 13 slices shown]
[im 1/13]
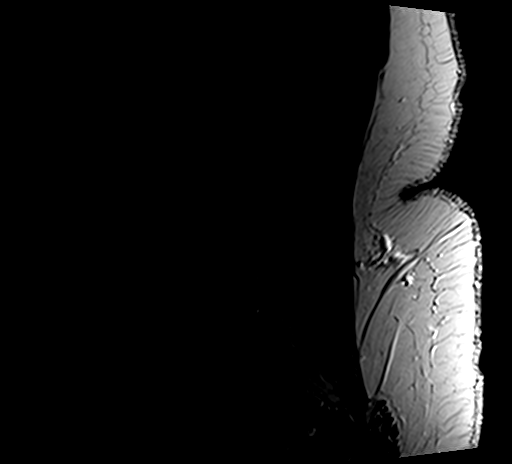
[im 3/13]
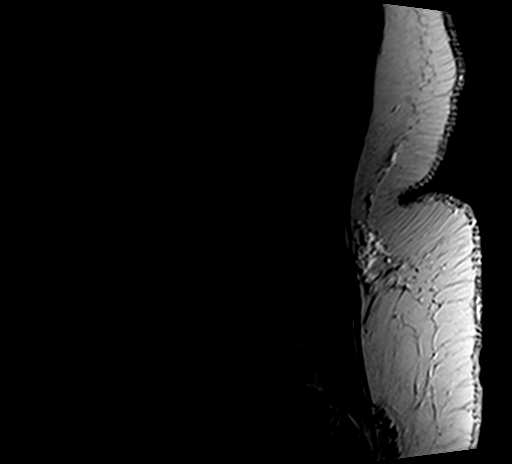
[im 5/13]
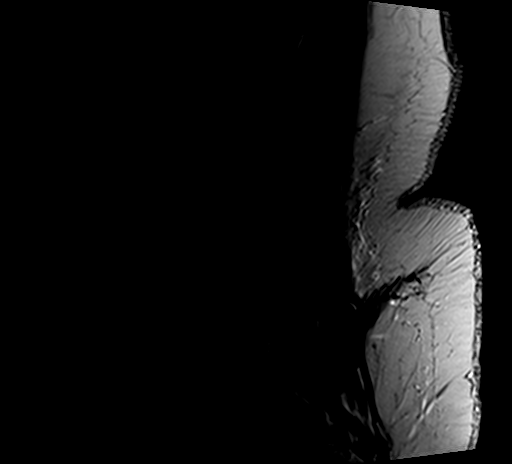
[im 8/13]
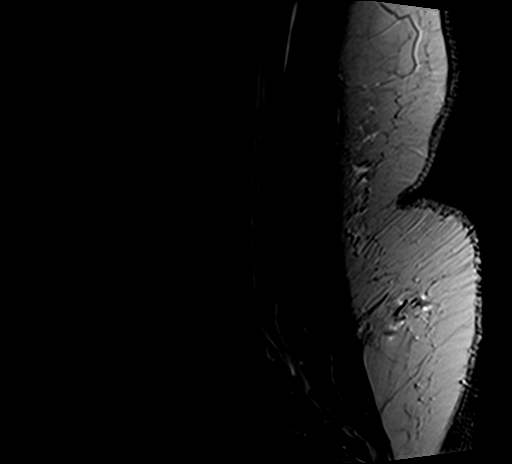
[im 10/13]
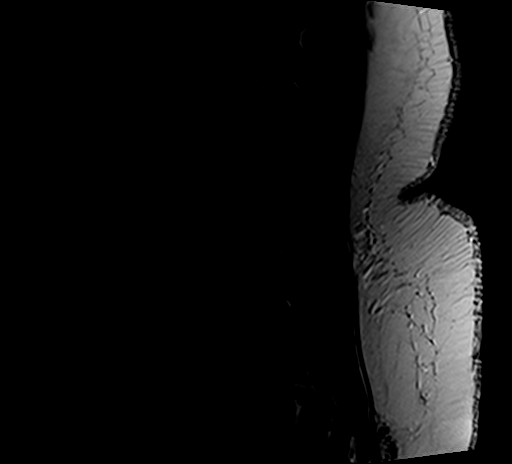
[im 13/13]
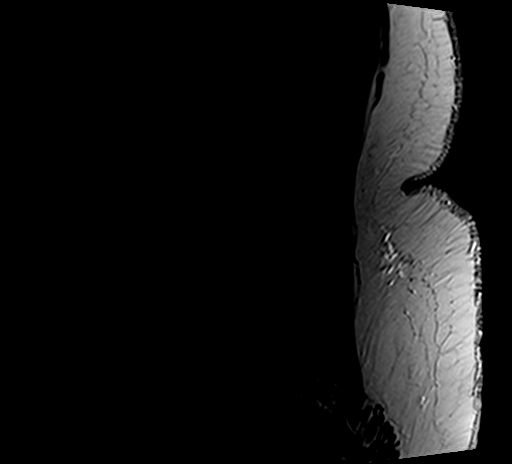

[Series 5: T1 · sagittal · 4.0mm · 0.55mm/px · 6 of 13 slices shown (1 of 2)]
[im 1/13]
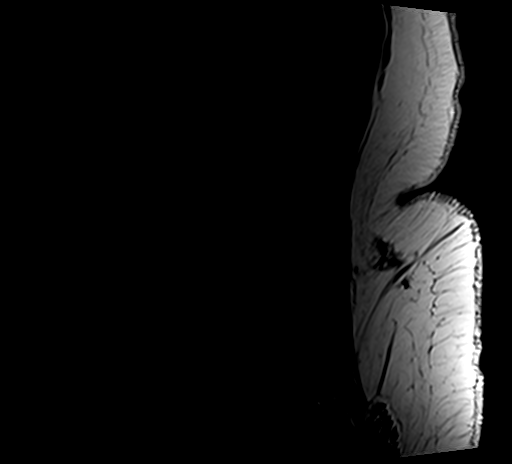
[im 3/13]
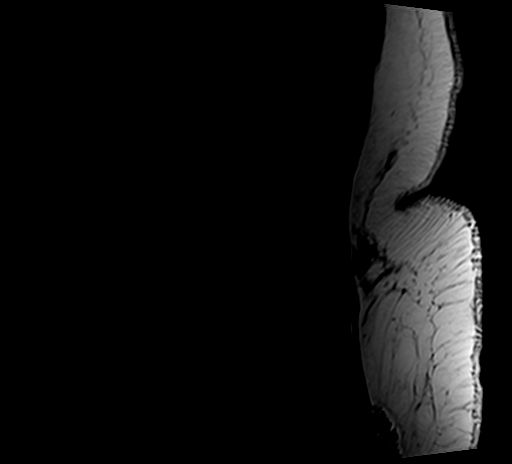
[im 5/13]
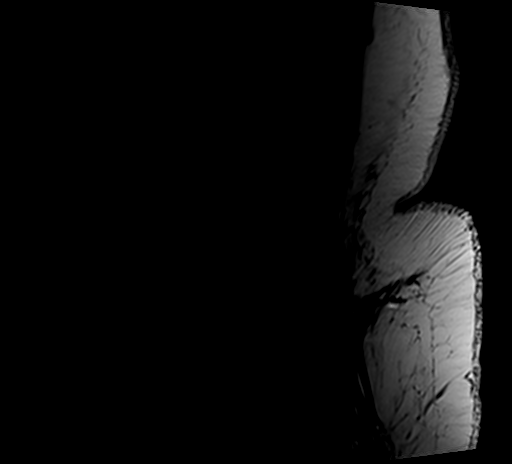
[im 8/13]
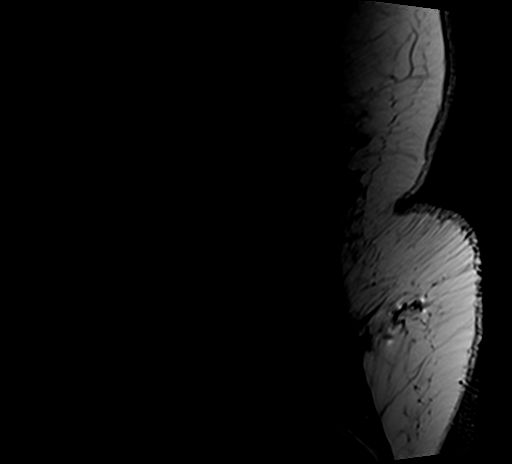
[im 10/13]
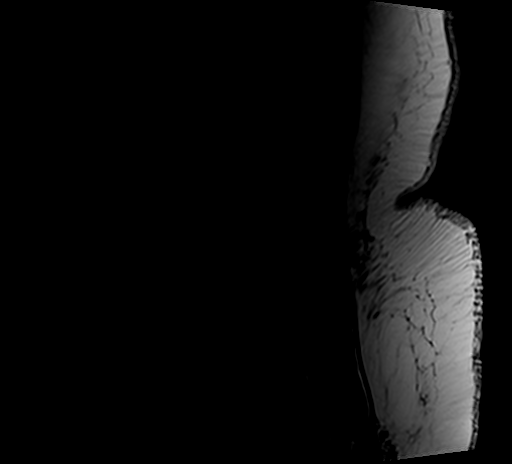
[im 13/13]
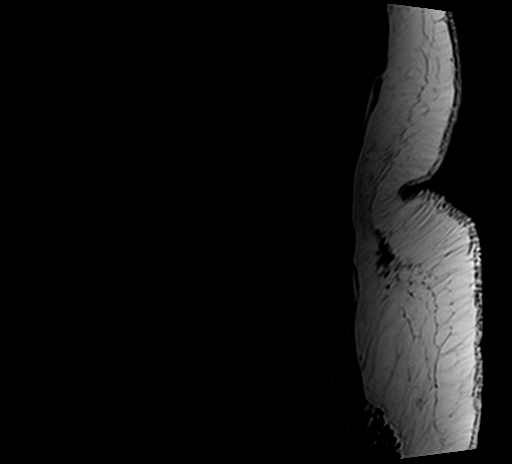

[Series 7: T1 · axial · 4.0mm · 0.35mm/px · z∈[-47,+105]mm · 6 of 32 slices shown (2 of 2)]
[im 1/32]
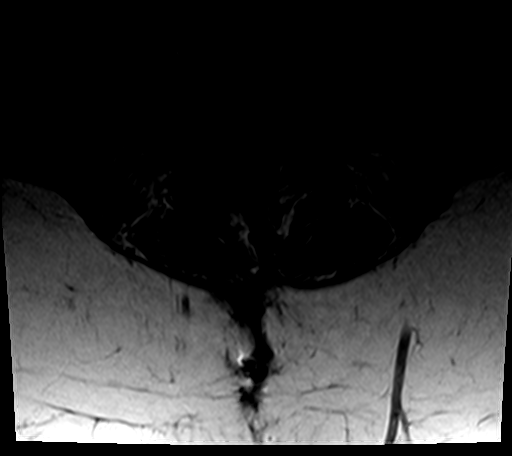
[im 5/32]
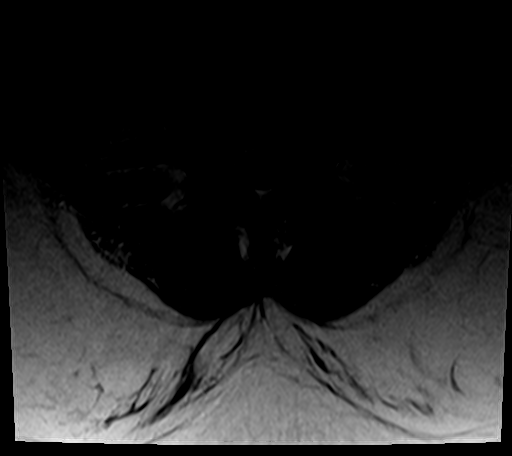
[im 9/32]
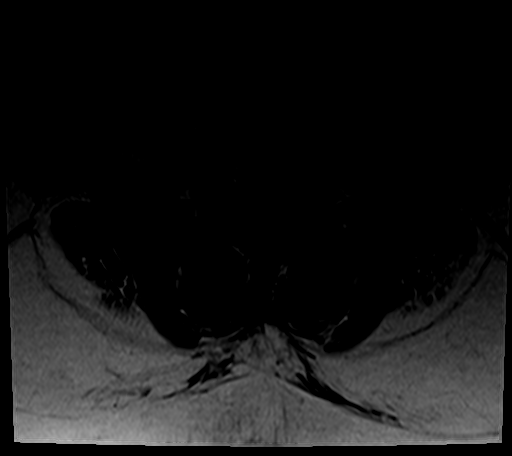
[im 14/32]
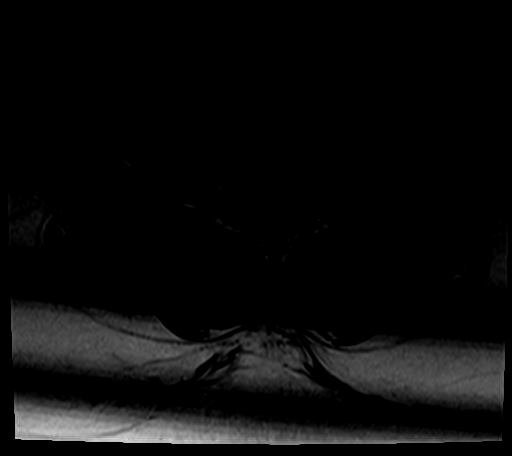
[im 16/32]
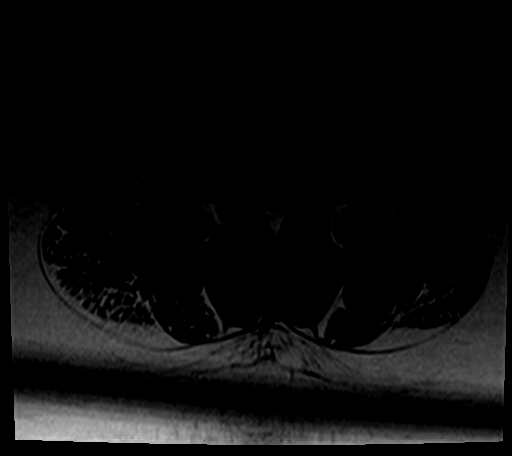
[im 27/32]
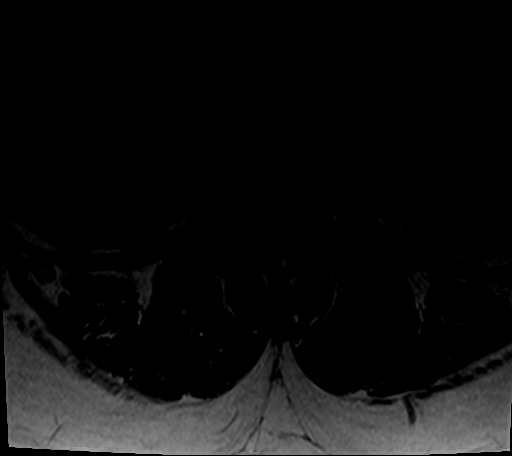

[Series 8: T2 · axial · 4.0mm · 0.70mm/px · z∈[-47,+155]mm · 9 of 32 slices shown]
[im 1/32]
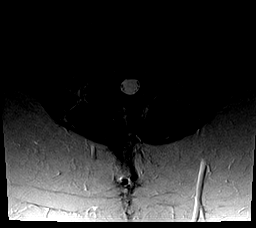
[im 5/32]
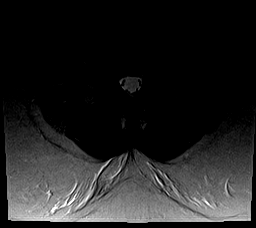
[im 9/32]
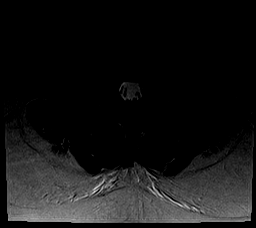
[im 14/32]
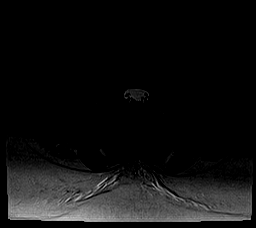
[im 16/32]
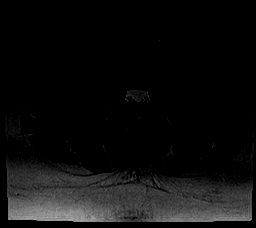
[im 18/32]
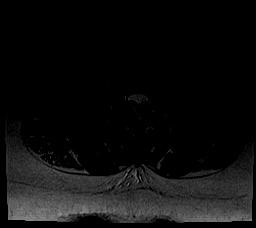
[im 23/32]
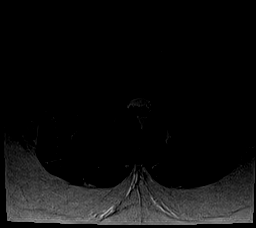
[im 27/32]
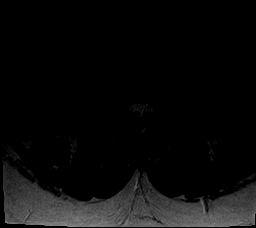
[im 32/32]
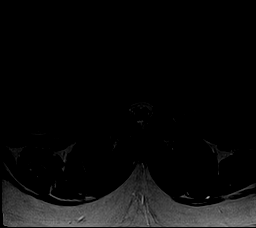

[27 of 48 positions shown; findings below may reference images not displayed]

FINDINGS: Segmentation:  Normal

Alignment: Mild retrolisthesis L2-3 and L3-4. Mild retrolisthesis
L5-S1.

Vertebrae:  Negative for fracture or mass.

Conus medullaris and cauda equina: Conus extends to the L1-2 level.
Conus and cauda equina appear normal.

Paraspinal and other soft tissues: Subcutaneous fluid collection at
L5-S1 has resolved since the prior study. No paraspinous soft tissue
mass.

Disc levels:

L1-2: Negative

L2-3: Mild disc and facet degeneration without stenosis.

L3-4: Mild disc and facet degeneration without stenosis.

L4-5: Mild facet degeneration. No significant disc degeneration or
stenosis.

L5-S1: Postop laminectomy on the right. Resolution of subcutaneous
fluid collection since the prior MRI. Progressive discogenic edema
in the bone marrow at L5-S1. Moderate joint space narrowing.
Broad-based central disc protrusion unchanged since the prior study.
Prior study was done with intravenous contrast and the disc
protrusion showed no enhancement. There is moderate subarticular
stenosis bilaterally which could affect the S1 nerve roots
bilaterally, right greater than left. Mass-like soft tissue
structure near the right S1 nerve root could represent recurrent
disc fragment versus nerve root swelling. This is not definitely
present on the prior study.
IMPRESSION: Mild degenerative changes at L2-3, L3-4, L4-5 without disc
protrusion or stenosis

Postop laminectomy right L5-S1. Moderately large central broad-based
disc protrusion L5-S1 unchanged from 11/05/2016. Progressive
discogenic edema. Possible recurrent extruded disc fragment on the
right versus swollen right S1 nerve root...

## 2020-01-03 DIAGNOSIS — Z6841 Body Mass Index (BMI) 40.0 and over, adult: Secondary | ICD-10-CM | POA: Diagnosis not present

## 2020-01-03 DIAGNOSIS — E118 Type 2 diabetes mellitus with unspecified complications: Secondary | ICD-10-CM | POA: Diagnosis not present

## 2020-01-03 DIAGNOSIS — Z1231 Encounter for screening mammogram for malignant neoplasm of breast: Secondary | ICD-10-CM | POA: Diagnosis not present

## 2020-01-03 DIAGNOSIS — E559 Vitamin D deficiency, unspecified: Secondary | ICD-10-CM | POA: Diagnosis not present

## 2020-01-03 DIAGNOSIS — Z79899 Other long term (current) drug therapy: Secondary | ICD-10-CM | POA: Diagnosis not present

## 2020-01-03 DIAGNOSIS — E7800 Pure hypercholesterolemia, unspecified: Secondary | ICD-10-CM | POA: Diagnosis not present

## 2020-01-07 DIAGNOSIS — M79671 Pain in right foot: Secondary | ICD-10-CM | POA: Diagnosis not present

## 2020-01-07 DIAGNOSIS — M7731 Calcaneal spur, right foot: Secondary | ICD-10-CM | POA: Diagnosis not present

## 2020-01-11 DIAGNOSIS — Z1231 Encounter for screening mammogram for malignant neoplasm of breast: Secondary | ICD-10-CM | POA: Diagnosis not present

## 2020-01-16 ENCOUNTER — Other Ambulatory Visit: Payer: Self-pay

## 2020-01-16 ENCOUNTER — Ambulatory Visit: Payer: BC Managed Care – PPO | Admitting: Podiatry

## 2020-01-16 ENCOUNTER — Other Ambulatory Visit: Payer: Self-pay | Admitting: Podiatry

## 2020-01-16 DIAGNOSIS — M722 Plantar fascial fibromatosis: Secondary | ICD-10-CM | POA: Diagnosis not present

## 2020-01-16 DIAGNOSIS — M216X9 Other acquired deformities of unspecified foot: Secondary | ICD-10-CM | POA: Diagnosis not present

## 2020-01-16 DIAGNOSIS — M7661 Achilles tendinitis, right leg: Secondary | ICD-10-CM | POA: Diagnosis not present

## 2020-01-16 DIAGNOSIS — M7731 Calcaneal spur, right foot: Secondary | ICD-10-CM

## 2020-01-16 DIAGNOSIS — M79671 Pain in right foot: Secondary | ICD-10-CM

## 2020-01-16 DIAGNOSIS — M7732 Calcaneal spur, left foot: Secondary | ICD-10-CM | POA: Diagnosis not present

## 2020-01-16 DIAGNOSIS — M79672 Pain in left foot: Secondary | ICD-10-CM

## 2020-01-16 DIAGNOSIS — M7662 Achilles tendinitis, left leg: Secondary | ICD-10-CM

## 2020-01-16 MED ORDER — DICLOFENAC SODIUM 50 MG PO TBEC: 50.0000 mg | DELAYED_RELEASE_TABLET | Freq: Three times a day (TID) | ORAL | 0 refills | Status: AC

## 2020-01-16 MED ORDER — METHYLPREDNISOLONE 4 MG PO TBPK: ORAL_TABLET | ORAL | 0 refills | Status: AC

## 2020-01-16 NOTE — Progress Notes (Signed)
  Subjective:  Patient ID: Jean Henderson, female    DOB: September 25, 1975,  MRN: [id number]  Chief Complaint  Patient presents with  . Foot Pain    BL bottom and back heel pain x 7 mo (Rt>Lt); 8/10 sharp stabbign constatn pain -have fallen off steps twice -wrose with work walking -w/ swelling Tx: none -pt also c/o BL platnar itching -pt states she was seen 1 mo ago at ER for same issue had xrs and was dz with bone spurs, pt went back 2 sundays ago and was given diclofenac (with little relife)     44  y.o. female presents with the above complaint. History confirmed with patient.   Objective:  Physical Exam: warm, good capillary refill, no trophic changes or ulcerative lesions, normal DP and PT pulses and normal sensory exam. Left Foot: tenderness to palpation medial calcaneal tuber, tenderness to palpation posterior calcaneus, no pain with calcaneal squeeze, decreased ankle joint ROM and +Silverskiold test Right Foot: tenderness to palpation medial calcaneal tuber, tenderness to palpation posterior calcaneus, no pain with calcaneal squeeze, decreased ankle joint ROM and +Silverskiold test normal exam, no swelling, tenderness, instability; ligaments intact, full range of motion of all ankle/foot joints other than findings noted above.  Radiographs: X-ray of both feet reviewed from [facility]: plantar calcaneal spur, posterior calcaneal spur and Haglund deformity noted  Assessment:   1. Achilles tendinitis of both lower extremities   2. Plantar fasciitis   3. Equinus deformity of foot   4. Calcaneal spur of left foot   5. Calcaneal spur of right foot      Plan:  Patient was evaluated and treated and all questions answered.  Achilles Tendonitis and Plantar Fasciitis -XR reviewed with patient -Educated on stretching and icing of the affected limb. -Night splint dispensed x 2 -Rx for Medrol and Voltaren. Advised to start Voltaren the day after completion of oral steroid taper.  No follow-ups on  file.

## 2020-02-02 DIAGNOSIS — E118 Type 2 diabetes mellitus with unspecified complications: Secondary | ICD-10-CM | POA: Diagnosis not present

## 2020-02-02 DIAGNOSIS — E7800 Pure hypercholesterolemia, unspecified: Secondary | ICD-10-CM | POA: Diagnosis not present

## 2020-02-02 DIAGNOSIS — M5126 Other intervertebral disc displacement, lumbar region: Secondary | ICD-10-CM | POA: Diagnosis not present

## 2020-02-02 DIAGNOSIS — Z6841 Body Mass Index (BMI) 40.0 and over, adult: Secondary | ICD-10-CM | POA: Diagnosis not present

## 2020-02-06 ENCOUNTER — Other Ambulatory Visit: Payer: Self-pay | Admitting: Podiatry

## 2020-02-15 ENCOUNTER — Ambulatory Visit (INDEPENDENT_AMBULATORY_CARE_PROVIDER_SITE_OTHER): Payer: BC Managed Care – PPO | Admitting: Podiatry

## 2020-02-15 DIAGNOSIS — Z5329 Procedure and treatment not carried out because of patient's decision for other reasons: Secondary | ICD-10-CM

## 2020-02-15 NOTE — Progress Notes (Signed)
No show for appt. 

## 2021-02-04 DIAGNOSIS — M722 Plantar fascial fibromatosis: Secondary | ICD-10-CM

## 2021-02-04 DIAGNOSIS — M7662 Achilles tendinitis, left leg: Secondary | ICD-10-CM

## 2021-02-04 DIAGNOSIS — M6702 Short Achilles tendon (acquired), left ankle: Secondary | ICD-10-CM | POA: Diagnosis not present

## 2021-02-04 DIAGNOSIS — M6701 Short Achilles tendon (acquired), right ankle: Secondary | ICD-10-CM | POA: Diagnosis not present

## 2021-02-04 HISTORY — DX: Plantar fascial fibromatosis: M72.2

## 2021-02-04 HISTORY — DX: Achilles tendinitis, left leg: M76.62

## 2021-02-05 DIAGNOSIS — M545 Low back pain, unspecified: Secondary | ICD-10-CM | POA: Diagnosis not present

## 2021-02-05 DIAGNOSIS — Z79899 Other long term (current) drug therapy: Secondary | ICD-10-CM | POA: Diagnosis not present

## 2021-02-05 DIAGNOSIS — Z6841 Body Mass Index (BMI) 40.0 and over, adult: Secondary | ICD-10-CM | POA: Diagnosis not present

## 2021-02-05 DIAGNOSIS — E876 Hypokalemia: Secondary | ICD-10-CM | POA: Diagnosis not present

## 2021-02-25 DIAGNOSIS — M6702 Short Achilles tendon (acquired), left ankle: Secondary | ICD-10-CM | POA: Diagnosis not present

## 2021-02-25 DIAGNOSIS — M6701 Short Achilles tendon (acquired), right ankle: Secondary | ICD-10-CM | POA: Diagnosis not present

## 2021-02-25 DIAGNOSIS — M7662 Achilles tendinitis, left leg: Secondary | ICD-10-CM | POA: Diagnosis not present

## 2021-03-04 DIAGNOSIS — I10 Essential (primary) hypertension: Secondary | ICD-10-CM | POA: Diagnosis not present

## 2021-03-04 DIAGNOSIS — E118 Type 2 diabetes mellitus with unspecified complications: Secondary | ICD-10-CM | POA: Diagnosis not present

## 2021-03-04 DIAGNOSIS — Z79899 Other long term (current) drug therapy: Secondary | ICD-10-CM | POA: Diagnosis not present

## 2021-03-04 DIAGNOSIS — E7800 Pure hypercholesterolemia, unspecified: Secondary | ICD-10-CM | POA: Diagnosis not present

## 2021-03-04 DIAGNOSIS — E559 Vitamin D deficiency, unspecified: Secondary | ICD-10-CM | POA: Diagnosis not present

## 2021-03-04 DIAGNOSIS — M545 Low back pain, unspecified: Secondary | ICD-10-CM | POA: Diagnosis not present

## 2021-03-11 DIAGNOSIS — R2689 Other abnormalities of gait and mobility: Secondary | ICD-10-CM | POA: Diagnosis not present

## 2021-03-11 DIAGNOSIS — M25571 Pain in right ankle and joints of right foot: Secondary | ICD-10-CM | POA: Diagnosis not present

## 2021-03-11 DIAGNOSIS — M25572 Pain in left ankle and joints of left foot: Secondary | ICD-10-CM | POA: Diagnosis not present

## 2021-03-11 DIAGNOSIS — M5489 Other dorsalgia: Secondary | ICD-10-CM | POA: Diagnosis not present

## 2021-03-18 DIAGNOSIS — Z0189 Encounter for other specified special examinations: Secondary | ICD-10-CM | POA: Diagnosis not present

## 2021-03-18 DIAGNOSIS — M961 Postlaminectomy syndrome, not elsewhere classified: Secondary | ICD-10-CM

## 2021-03-18 DIAGNOSIS — M5416 Radiculopathy, lumbar region: Secondary | ICD-10-CM

## 2021-03-18 DIAGNOSIS — G8929 Other chronic pain: Secondary | ICD-10-CM | POA: Diagnosis not present

## 2021-03-18 DIAGNOSIS — M5116 Intervertebral disc disorders with radiculopathy, lumbar region: Secondary | ICD-10-CM | POA: Diagnosis not present

## 2021-03-18 DIAGNOSIS — Z0289 Encounter for other administrative examinations: Secondary | ICD-10-CM

## 2021-03-18 HISTORY — DX: Encounter for other administrative examinations: Z02.89

## 2021-03-18 HISTORY — DX: Radiculopathy, lumbar region: M54.16

## 2021-03-18 HISTORY — DX: Postlaminectomy syndrome, not elsewhere classified: M96.1

## 2021-04-07 DIAGNOSIS — I10 Essential (primary) hypertension: Secondary | ICD-10-CM | POA: Diagnosis not present

## 2021-04-15 DIAGNOSIS — M961 Postlaminectomy syndrome, not elsewhere classified: Secondary | ICD-10-CM | POA: Diagnosis not present

## 2021-04-15 DIAGNOSIS — G8929 Other chronic pain: Secondary | ICD-10-CM | POA: Diagnosis not present

## 2021-04-15 DIAGNOSIS — Z79891 Long term (current) use of opiate analgesic: Secondary | ICD-10-CM | POA: Diagnosis not present

## 2021-04-15 DIAGNOSIS — M5116 Intervertebral disc disorders with radiculopathy, lumbar region: Secondary | ICD-10-CM | POA: Diagnosis not present

## 2021-07-08 DIAGNOSIS — M961 Postlaminectomy syndrome, not elsewhere classified: Secondary | ICD-10-CM | POA: Diagnosis not present

## 2021-07-08 DIAGNOSIS — M5116 Intervertebral disc disorders with radiculopathy, lumbar region: Secondary | ICD-10-CM | POA: Diagnosis not present

## 2021-07-08 DIAGNOSIS — G8929 Other chronic pain: Secondary | ICD-10-CM | POA: Diagnosis not present

## 2021-07-08 DIAGNOSIS — E66813 Obesity, class 3: Secondary | ICD-10-CM

## 2021-07-08 DIAGNOSIS — Z720 Tobacco use: Secondary | ICD-10-CM

## 2021-07-08 DIAGNOSIS — F1190 Opioid use, unspecified, uncomplicated: Secondary | ICD-10-CM | POA: Insufficient documentation

## 2021-07-08 DIAGNOSIS — R030 Elevated blood-pressure reading, without diagnosis of hypertension: Secondary | ICD-10-CM | POA: Insufficient documentation

## 2021-07-08 HISTORY — DX: Opioid use, unspecified, uncomplicated: F11.90

## 2021-07-08 HISTORY — DX: Tobacco use: Z72.0

## 2021-07-08 HISTORY — DX: Obesity, class 3: E66.813

## 2021-07-08 HISTORY — DX: Elevated blood-pressure reading, without diagnosis of hypertension: R03.0

## 2021-07-16 DIAGNOSIS — R0602 Shortness of breath: Secondary | ICD-10-CM | POA: Diagnosis not present

## 2021-07-16 DIAGNOSIS — E119 Type 2 diabetes mellitus without complications: Secondary | ICD-10-CM | POA: Diagnosis not present

## 2021-07-16 DIAGNOSIS — R5382 Chronic fatigue, unspecified: Secondary | ICD-10-CM | POA: Diagnosis not present

## 2021-07-23 DIAGNOSIS — E669 Obesity, unspecified: Secondary | ICD-10-CM | POA: Diagnosis not present

## 2021-08-13 DIAGNOSIS — D518 Other vitamin B12 deficiency anemias: Secondary | ICD-10-CM | POA: Diagnosis not present

## 2021-08-13 DIAGNOSIS — I10 Essential (primary) hypertension: Secondary | ICD-10-CM | POA: Diagnosis not present

## 2021-08-13 DIAGNOSIS — E559 Vitamin D deficiency, unspecified: Secondary | ICD-10-CM | POA: Diagnosis not present

## 2021-08-13 DIAGNOSIS — E782 Mixed hyperlipidemia: Secondary | ICD-10-CM | POA: Diagnosis not present

## 2021-08-13 DIAGNOSIS — Z0000 Encounter for general adult medical examination without abnormal findings: Secondary | ICD-10-CM | POA: Diagnosis not present

## 2021-08-13 DIAGNOSIS — E119 Type 2 diabetes mellitus without complications: Secondary | ICD-10-CM | POA: Diagnosis not present

## 2021-08-13 DIAGNOSIS — E038 Other specified hypothyroidism: Secondary | ICD-10-CM | POA: Diagnosis not present

## 2021-08-13 DIAGNOSIS — R5382 Chronic fatigue, unspecified: Secondary | ICD-10-CM | POA: Diagnosis not present

## 2021-08-16 DIAGNOSIS — E782 Mixed hyperlipidemia: Secondary | ICD-10-CM | POA: Diagnosis not present

## 2021-08-16 DIAGNOSIS — H6011 Cellulitis of right external ear: Secondary | ICD-10-CM | POA: Diagnosis not present

## 2021-08-25 DIAGNOSIS — E669 Obesity, unspecified: Secondary | ICD-10-CM | POA: Diagnosis not present

## 2021-09-02 DIAGNOSIS — M5116 Intervertebral disc disorders with radiculopathy, lumbar region: Secondary | ICD-10-CM | POA: Diagnosis not present

## 2021-09-02 DIAGNOSIS — G8929 Other chronic pain: Secondary | ICD-10-CM | POA: Diagnosis not present

## 2021-09-02 DIAGNOSIS — Z6841 Body Mass Index (BMI) 40.0 and over, adult: Secondary | ICD-10-CM | POA: Diagnosis not present

## 2021-09-02 DIAGNOSIS — M961 Postlaminectomy syndrome, not elsewhere classified: Secondary | ICD-10-CM | POA: Diagnosis not present

## 2021-09-26 DIAGNOSIS — L723 Sebaceous cyst: Secondary | ICD-10-CM | POA: Diagnosis not present

## 2021-09-26 DIAGNOSIS — N764 Abscess of vulva: Secondary | ICD-10-CM | POA: Diagnosis not present

## 2021-12-02 DIAGNOSIS — F1190 Opioid use, unspecified, uncomplicated: Secondary | ICD-10-CM | POA: Diagnosis not present

## 2021-12-02 DIAGNOSIS — Z599 Problem related to housing and economic circumstances, unspecified: Secondary | ICD-10-CM

## 2021-12-02 DIAGNOSIS — M961 Postlaminectomy syndrome, not elsewhere classified: Secondary | ICD-10-CM | POA: Diagnosis not present

## 2021-12-02 DIAGNOSIS — M5416 Radiculopathy, lumbar region: Secondary | ICD-10-CM | POA: Diagnosis not present

## 2021-12-02 HISTORY — DX: Problem related to housing and economic circumstances, unspecified: Z59.9

## 2021-12-14 DIAGNOSIS — M4807 Spinal stenosis, lumbosacral region: Secondary | ICD-10-CM | POA: Diagnosis not present

## 2021-12-14 DIAGNOSIS — M4727 Other spondylosis with radiculopathy, lumbosacral region: Secondary | ICD-10-CM | POA: Diagnosis not present

## 2021-12-14 DIAGNOSIS — M4726 Other spondylosis with radiculopathy, lumbar region: Secondary | ICD-10-CM | POA: Diagnosis not present

## 2021-12-14 DIAGNOSIS — M5117 Intervertebral disc disorders with radiculopathy, lumbosacral region: Secondary | ICD-10-CM | POA: Diagnosis not present

## 2021-12-23 DIAGNOSIS — M5416 Radiculopathy, lumbar region: Secondary | ICD-10-CM | POA: Diagnosis not present

## 2021-12-23 DIAGNOSIS — M545 Low back pain, unspecified: Secondary | ICD-10-CM | POA: Diagnosis not present

## 2021-12-23 DIAGNOSIS — M5417 Radiculopathy, lumbosacral region: Secondary | ICD-10-CM | POA: Diagnosis not present

## 2022-01-05 DIAGNOSIS — J069 Acute upper respiratory infection, unspecified: Secondary | ICD-10-CM | POA: Diagnosis not present

## 2022-01-05 DIAGNOSIS — U071 COVID-19: Secondary | ICD-10-CM | POA: Diagnosis not present

## 2022-01-05 DIAGNOSIS — Z20828 Contact with and (suspected) exposure to other viral communicable diseases: Secondary | ICD-10-CM | POA: Diagnosis not present

## 2022-01-28 DIAGNOSIS — E119 Type 2 diabetes mellitus without complications: Secondary | ICD-10-CM | POA: Diagnosis not present

## 2022-01-28 DIAGNOSIS — E559 Vitamin D deficiency, unspecified: Secondary | ICD-10-CM | POA: Diagnosis not present

## 2022-01-28 DIAGNOSIS — D518 Other vitamin B12 deficiency anemias: Secondary | ICD-10-CM | POA: Diagnosis not present

## 2022-01-28 DIAGNOSIS — E782 Mixed hyperlipidemia: Secondary | ICD-10-CM | POA: Diagnosis not present

## 2022-01-28 DIAGNOSIS — E038 Other specified hypothyroidism: Secondary | ICD-10-CM | POA: Diagnosis not present

## 2022-01-28 DIAGNOSIS — I10 Essential (primary) hypertension: Secondary | ICD-10-CM | POA: Diagnosis not present

## 2022-02-25 DIAGNOSIS — E782 Mixed hyperlipidemia: Secondary | ICD-10-CM | POA: Diagnosis not present

## 2022-02-25 DIAGNOSIS — I10 Essential (primary) hypertension: Secondary | ICD-10-CM | POA: Diagnosis not present

## 2022-02-25 DIAGNOSIS — E119 Type 2 diabetes mellitus without complications: Secondary | ICD-10-CM | POA: Diagnosis not present

## 2022-03-11 DIAGNOSIS — M961 Postlaminectomy syndrome, not elsewhere classified: Secondary | ICD-10-CM | POA: Diagnosis not present

## 2022-03-11 DIAGNOSIS — R030 Elevated blood-pressure reading, without diagnosis of hypertension: Secondary | ICD-10-CM | POA: Diagnosis not present

## 2022-03-11 DIAGNOSIS — F1190 Opioid use, unspecified, uncomplicated: Secondary | ICD-10-CM | POA: Diagnosis not present

## 2022-03-23 DIAGNOSIS — J069 Acute upper respiratory infection, unspecified: Secondary | ICD-10-CM | POA: Diagnosis not present

## 2022-03-23 DIAGNOSIS — J040 Acute laryngitis: Secondary | ICD-10-CM | POA: Diagnosis not present

## 2022-05-10 DIAGNOSIS — I493 Ventricular premature depolarization: Secondary | ICD-10-CM | POA: Diagnosis not present

## 2022-05-10 DIAGNOSIS — E876 Hypokalemia: Secondary | ICD-10-CM | POA: Diagnosis not present

## 2022-05-10 DIAGNOSIS — R42 Dizziness and giddiness: Secondary | ICD-10-CM | POA: Diagnosis not present

## 2022-05-10 DIAGNOSIS — H538 Other visual disturbances: Secondary | ICD-10-CM | POA: Diagnosis not present

## 2022-05-10 DIAGNOSIS — H8110 Benign paroxysmal vertigo, unspecified ear: Secondary | ICD-10-CM | POA: Diagnosis not present

## 2022-05-10 DIAGNOSIS — R55 Syncope and collapse: Secondary | ICD-10-CM | POA: Diagnosis not present

## 2022-05-10 DIAGNOSIS — F17210 Nicotine dependence, cigarettes, uncomplicated: Secondary | ICD-10-CM | POA: Diagnosis not present

## 2022-05-10 DIAGNOSIS — E860 Dehydration: Secondary | ICD-10-CM | POA: Diagnosis not present

## 2022-05-18 DIAGNOSIS — F17210 Nicotine dependence, cigarettes, uncomplicated: Secondary | ICD-10-CM | POA: Diagnosis not present

## 2022-05-18 DIAGNOSIS — J301 Allergic rhinitis due to pollen: Secondary | ICD-10-CM | POA: Diagnosis not present

## 2022-05-18 DIAGNOSIS — G4733 Obstructive sleep apnea (adult) (pediatric): Secondary | ICD-10-CM | POA: Diagnosis not present

## 2022-05-18 DIAGNOSIS — R5383 Other fatigue: Secondary | ICD-10-CM | POA: Diagnosis not present

## 2022-05-18 DIAGNOSIS — Z8616 Personal history of COVID-19: Secondary | ICD-10-CM | POA: Diagnosis not present

## 2022-05-20 DIAGNOSIS — D3502 Benign neoplasm of left adrenal gland: Secondary | ICD-10-CM | POA: Diagnosis not present

## 2022-05-20 DIAGNOSIS — R1084 Generalized abdominal pain: Secondary | ICD-10-CM | POA: Diagnosis not present

## 2022-05-20 DIAGNOSIS — D3002 Benign neoplasm of left kidney: Secondary | ICD-10-CM | POA: Diagnosis not present

## 2022-05-20 DIAGNOSIS — I491 Atrial premature depolarization: Secondary | ICD-10-CM | POA: Diagnosis not present

## 2022-05-20 DIAGNOSIS — R1010 Upper abdominal pain, unspecified: Secondary | ICD-10-CM | POA: Diagnosis not present

## 2022-05-20 DIAGNOSIS — E876 Hypokalemia: Secondary | ICD-10-CM | POA: Diagnosis not present

## 2022-05-20 DIAGNOSIS — R079 Chest pain, unspecified: Secondary | ICD-10-CM | POA: Diagnosis not present

## 2022-05-20 DIAGNOSIS — R0902 Hypoxemia: Secondary | ICD-10-CM | POA: Diagnosis not present

## 2022-05-20 DIAGNOSIS — K219 Gastro-esophageal reflux disease without esophagitis: Secondary | ICD-10-CM | POA: Diagnosis not present

## 2022-05-20 DIAGNOSIS — M2578 Osteophyte, vertebrae: Secondary | ICD-10-CM | POA: Diagnosis not present

## 2022-05-20 DIAGNOSIS — Z9049 Acquired absence of other specified parts of digestive tract: Secondary | ICD-10-CM | POA: Diagnosis not present

## 2022-05-26 ENCOUNTER — Encounter: Payer: Self-pay | Admitting: Cardiology

## 2022-05-26 ENCOUNTER — Ambulatory Visit: Payer: BC Managed Care – PPO | Attending: Cardiology

## 2022-05-26 ENCOUNTER — Other Ambulatory Visit: Payer: Self-pay | Admitting: Cardiology

## 2022-05-26 ENCOUNTER — Ambulatory Visit: Payer: BC Managed Care – PPO | Attending: Cardiology | Admitting: Cardiology

## 2022-05-26 VITALS — BP 110/90 | HR 108 | Ht 65.0 in | Wt 278.4 lb

## 2022-05-26 DIAGNOSIS — R0602 Shortness of breath: Secondary | ICD-10-CM | POA: Diagnosis not present

## 2022-05-26 DIAGNOSIS — E119 Type 2 diabetes mellitus without complications: Secondary | ICD-10-CM

## 2022-05-26 DIAGNOSIS — I10 Essential (primary) hypertension: Secondary | ICD-10-CM

## 2022-05-26 DIAGNOSIS — Z720 Tobacco use: Secondary | ICD-10-CM | POA: Diagnosis not present

## 2022-05-26 DIAGNOSIS — I493 Ventricular premature depolarization: Secondary | ICD-10-CM | POA: Diagnosis not present

## 2022-05-26 DIAGNOSIS — E876 Hypokalemia: Secondary | ICD-10-CM | POA: Insufficient documentation

## 2022-05-26 DIAGNOSIS — F1190 Opioid use, unspecified, uncomplicated: Secondary | ICD-10-CM

## 2022-05-26 NOTE — Patient Instructions (Signed)
Medication Instructions:  Your physician recommends that you continue on your current medications as directed. Please refer to the Current Medication list given to you today.  *If you need a refill on your cardiac medications before your next appointment, please call your pharmacy*   Lab Work: CMP, [facility]- today If you have labs (blood work) drawn today and your tests are completely normal, you will receive your results only by: MyChart Message (if you have MyChart) OR A paper copy in the mail If you have any lab test that is abnormal or we need to change your treatment, we will call you to review the results.   Testing/Procedures: Your physician has requested that you have an echocardiogram. Echocardiography is a painless test that uses sound waves to create images of your heart. It provides your doctor with information about the size and shape of your heart and how well your heart's chambers and valves are working. This procedure takes approximately one hour. There are no restrictions for this procedure. Please do NOT wear cologne, perfume, aftershave, or lotions (deodorant is allowed). Please arrive 15 minutes prior to your appointment time.    WHY IS MY DOCTOR PRESCRIBING ZIO? The Zio system is proven and trusted by physicians to detect and diagnose irregular heart rhythms -- and has been prescribed to hundreds of thousands of patients.  The FDA has cleared the Zio system to monitor for many different kinds of irregular heart rhythms. In a study, physicians were able to reach a diagnosis 90% of the time with the Zio system1.  You can wear the Zio monitor -- a small, discreet, comfortable patch -- during your normal day-to-day activity, including while you sleep, shower, and exercise, while it records every single heartbeat for analysis.  1Barrett, P., et al. Comparison of 24 Hour Holter Monitoring Versus 14 Day Novel Adhesive Patch Electrocardiographic Monitoring. American Journal of  Medicine, 2014.  ZIO VS. HOLTER MONITORING The Zio monitor can be comfortably worn for up to 14 days. Holter monitors can be worn for 24 to 48 hours, limiting the time to record any irregular heart rhythms you may have. Zio is able to capture data for the 51% of patients who have their first symptom-triggered arrhythmia after 48 hours.1  LIVE WITHOUT RESTRICTIONS The Zio ambulatory cardiac monitor is a small, unobtrusive, and water-resistant patch--you might even forget you're wearing it. The Zio monitor records and stores every beat of your heart, whether you're sleeping, working out, or showering.     Follow-Up: At [facility], you and your health needs are our priority.  As part of our continuing mission to provide you with exceptional heart care, we have created designated Provider Care Teams.  These Care Teams include your primary Cardiologist (physician) and Advanced Practice Providers (APPs -  Physician Assistants and Nurse Practitioners) who all work together to provide you with the care you need, when you need it.  We recommend signing up for the patient portal called "MyChart".  Sign up information is provided on this After Visit Summary.  MyChart is used to connect with patients for Virtual Visits (Telemedicine).  Patients are able to view lab/test results, encounter notes, upcoming appointments, etc.  Non-urgent messages can be sent to your provider as well.   To learn more about what you can do with MyChart, go to [email or web address].    Your next appointment:   3 month(s)  The format for your next appointment:   In Person  Provider:   Gypsy Balsam, MD  Other Instructions NA  

## 2022-05-26 NOTE — Progress Notes (Signed)
Cardiology Consultation:    Date:  05/26/2022   ID:  Jean Henderson, DOB [birth year], MRN [id number]  PCP:  System, Provider Not In  Cardiologist:  Jenne Campus, MD   Referring MD: No ref. provider found   Chief Complaint  Patient presents with   Dizziness    Seen at [facility], dx with PVC very frequently. On Metoprolol Succ which helps    History of Present Illness:    Jean Henderson is a 46 y.o. female who is being seen today for the evaluation of dizziness and PVCs at the request of No ref. provider found.  Past medical history significant for chronic back pain for which she uses narcotics, diabetes which was recently recognized, essential hypertension, smoking which is still ongoing.  Recently she ended up going to the emergency because of episode of dizziness.  It was kind of lightheadedness to the point of passing out.  While in the emergency she was found to have severe hypokalemia with PVCs.  Origin of PVCs is right ventricle outflow tract.  She never had full syncope.  She did not have it before.  She is very concerned about it.  She is trying to go to gym and exercise on the regular basis she is trying to lose some weight.  However so far not much success.  She stopped going to the gym since she was told to have PVCs.  Never had any heart trouble.  There is family history of coronary artery disease but none of this is premature.  She is not on any special diet  Past Medical History:  Diagnosis Date   Bilateral plantar fasciitis 02/04/2021   Chronic, continuous use of opioids 07/08/2021   Last Assessment & Plan: Formatting of this note might be different from the original. Patient and I have discussed the hazardous effects of continued opiate pain medication usage. Risks and benefits of above medications including but not limited to possibility of hyperalgesia, respiratory depression, sedation, and even death were discussed with the patient who expressed an understanding. Patient i    Class 3 severe obesity due to excess calories with body mass index (BMI) of 45.0 to 49.9 in adult [facility]) 07/08/2021   Last Assessment & Plan: Formatting of this note might be different from the original. Patient educated about the detrimental effects of weight as it specifically pertains to pain management and overall health.  Patient's BMI 45.93 Encouraged healthy eating habits and routine low-impact cardiovascular exercises as tolerated.   Diabetes mellitus without complication ([place])    Dyspnea    with exersion   Elevated blood pressure reading 07/08/2021   Last Assessment & Plan: Formatting of this note might be different from the original. Blood pressure elevated in clinic today.  Discussed detrimental effects of uncontrolled blood pressure including heart attack, stroke, and possible kidney failure. Patient has had consistent elevated blood pressures over the last several visits with [place].  She continues to work with her PCP for ongoing management.    Financial difficulty 12/02/2021   Last Assessment & Plan: Formatting of this note might be different from the original. Recommend reaching back out to cares suite for assistance.  Telephone number provided.   Generalized anxiety disorder 12/08/2016   HNP (herniated nucleus pulposus), lumbar 10/22/2016   Hypertension    Ingrown nail 04/20/2017   Lumbar post-laminectomy syndrome 03/18/2021   Last Assessment & Plan: Formatting of this note might be different from the original. 46 year old female seen today in routine follow-up for chronic  lower back pain with predominantly left lower extremity radicular pain.  Historically, she has complained of primarily right lower extremity radicular pain that improved following L5-S1 laminectomy.   Review of lumbar spine detailed in HPI.  Patient r   Lumbar radiculopathy 03/18/2021   Last Assessment & Plan: Formatting of this note might be different from the original. Continue gabapentin as directed.   Major  depressive disorder, recurrent episode, severe ([place]) 12/08/2016   Obesity    Pain medication agreement 03/18/2021   Last Assessment & Plan: Formatting of this note might be different from the original. Agreement up-to-date. UDS to be collected in compliance with clinic policies and procedures.  Previous UDS results reviewed. Patient did not display any signs of abuse or diversion and has been checked on the [facility] Controlled Substance website.   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 Discussed the detrimental effects of tobacco on bone and overall health.  Encourage smoking cessation.    Past Surgical History:  Procedure Laterality Date   CESAREAN SECTION     x2   CHOLECYSTECTOMY     LUMBAR LAMINECTOMY/DECOMPRESSION MICRODISCECTOMY Right 10/22/2016   Procedure: Right Lumbar Five-Sacral One Microdiscectomy;  Surgeon: Kary Kos, MD;  Location: [place];  Service: Neurosurgery;  Laterality: Right;   Mirena insertion     Vaginal   TUBAL LIGATION      Current Medications: Current Meds  Medication Sig   ACCU-CHEK GUIDE test strip 1 each by Other route as needed for other (Glucose check).   acetaminophen (TYLENOL) 500 MG tablet Take 1,000 mg by mouth every 6 (six) hours as needed for moderate pain or headache.   amLODipine (NORVASC) 2.5 MG tablet Take 2.5 mg by mouth daily.   amLODipine-valsartan (EXFORGE) 10-320 MG tablet Take 1 tablet by mouth daily.   aspirin 81 MG chewable tablet Chew 81 mg by mouth daily.   Blood Glucose Monitoring Suppl (ACCU-CHEK GUIDE ME) w/Device KIT 1 each by Other route as directed. Glucose check   calcium carbonate (OS-CAL - DOSED IN MG OF ELEMENTAL CALCIUM) 1250 (500 Ca) MG tablet Take 1 tablet by  mouth daily with breakfast.   clonazePAM (KLONOPIN) 1 MG tablet Take 1 mg by mouth daily.   clotrimazole-betamethasone (LOTRISONE) cream Apply 1 application topically 2 (two) times daily.   cyclobenzaprine (FLEXERIL) 10 MG tablet Take 1 tablet (10 mg total) by mouth 3 (three) times daily as needed for muscle spasms.   diclofenac (VOLTAREN) 50 MG EC tablet Take 1 tablet (50 mg total) by mouth 3 (three) times daily. Start the day after completion of the steroid pack   DULoxetine (CYMBALTA) 60 MG capsule Take 60 mg by mouth 2 (two) times daily.   Exenatide ER (BYDUREON) 2 MG PEN Inject 2 mg into the skin every Saturday.   gabapentin (NEURONTIN) 300 MG capsule Take 1 capsule (300 mg total) by mouth 3 (three) times daily. (Patient taking differently: Take 600 mg by mouth 3 (three) times daily.)   ibuprofen (ADVIL,MOTRIN) 200 MG tablet Take 800 mg by mouth every 6 (six) hours as needed for moderate pain.    loratadine (CLARITIN) 10 MG tablet Take 10 mg by mouth daily.   metFORMIN (GLUCOPHAGE) 500 MG tablet Take 500  mg by mouth 2 (two) times daily with a meal.   methylPREDNISolone (MEDROL DOSEPAK) 4 MG TBPK tablet 6 Day Taper Pack. Take as Directed. (Patient taking differently: Take 4 mg by mouth as needed. 6 Day Taper Pack. Take as Directed.)   metoprolol succinate (TOPROL-XL) 50 MG 24 hr tablet Take 50 mg by mouth daily.    oxyCODONE (OXY IR/ROXICODONE) 5 MG immediate release tablet Take 1-2 tablets (5-10 mg total) by mouth every 3 (three) hours as needed for breakthrough pain.   potassium chloride (K-DUR,KLOR-CON) 10 MEQ tablet Take 10 mEq by mouth daily.    venlafaxine (EFFEXOR) 75 MG tablet Take 75 mg by mouth daily.   vitamin B-12 (CYANOCOBALAMIN) 1000 MCG tablet Take 2,000 mcg by mouth daily.   Vitamin D, Ergocalciferol, (DRISDOL) 50000 units CAPS capsule Take 50,000 Units by mouth every Saturday.   [DISCONTINUED] cyclobenzaprine (FLEXERIL) 10 MG tablet Take 10 mg by mouth 3 (three) times daily as  needed for muscle spasms.     Allergies:   Bee pollen, Other, and Latex   Social History   Socioeconomic History   Marital status: Single    Spouse name: Not on file   Number of children: Not on file   Years of education: Not on file   Highest education level: Not on file  Occupational History   Not on file  Tobacco Use   Smoking status: Every Day    Packs/day: 1.00    Years: 9.00    Total pack years: 9.00    Types: Cigarettes   Smokeless tobacco: Never  Substance and Sexual Activity   Alcohol use: No   Drug use: No   Sexual activity: Not on file  Other Topics Concern   Not on file  Social History Narrative   Not on file   Social Determinants of Health   Financial Resource Strain: Not on file  Food Insecurity: Not on file  Transportation Needs: Not on file  Physical Activity: Not on file  Stress: Not on file  Social Connections: Not on file     Family History: The patient's family history includes Cancer in her maternal grandfather, maternal grandmother, paternal grandfather, and paternal grandmother; Dementia in her mother; Heart disease in her brother and father; Hypertension in her father and mother; Kidney failure in her father. ROS:   Please see the history of present illness.    All 14 point review of systems negative except as described per history of present illness.  EKGs/Labs/Other Studies Reviewed:    The following studies were reviewed today:   EKG:  EKG is  ordered today.  The ekg ordered today demonstrates sinus tachycardia rate 101, criteria for LVH, left axis deviation, PVCs in origin is RVOT  Recent Labs: No results found for requested labs within last 365 days.  Recent Lipid Panel No results found for: "CHOL", "TRIG", "HDL", "CHOLHDL", "VLDL", "LDLCALC", "LDLDIRECT"  Physical Exam:    VS:  BP (!) 110/90 (BP Location: Left Arm, Patient Position: Sitting)   Pulse (!) 108   Ht _0  (1.651 m)   Wt 278 lb 6.4 oz (126.3 kg)   SpO2 90%    BMI 46.33 kg/m     Wt Readings from Last 3 Encounters:  05/26/22 278 lb 6.4 oz (126.3 kg)  10/22/16 295 lb (133.8 kg)  10/20/16 295 lb 9.6 oz (134.1 kg)     GEN:  Well nourished, well developed in no acute distress HEENT: Normal NECK: No JVD; No carotid bruits  LYMPHATICS: No lymphadenopathy CARDIAC: RRR, no murmurs, no rubs, no gallops RESPIRATORY:  Clear to auscultation without rales, wheezing or rhonchi  ABDOMEN: Soft, non-tender, non-distended MUSCULOSKELETAL:  No edema; No deformity  SKIN: Warm and dry NEUROLOGIC:  Alert and oriented x 3 PSYCHIATRIC:  Normal affect   ASSESSMENT:    1. Shortness of breath   2. PVC (premature ventricular contraction)   3. Ventricular ectopy   4. Diabetes mellitus without complication ([place])   5. Tobacco use   6. Chronic, continuous use of opioids   7. Essential hypertension   8. Hypokalemia    PLAN:    In order of problems listed above:  Dizziness with frequent PVCs.  I will ask her to wear a monitor to see burden of PVCs.  I think etiology of this is hypokalemia probably hypomagnesemia.  She did have supplementation of hypokalemia.  I will ask her to have her complete metabolic panel checked as well as magnesium.  She described to have frequent cramps.  Therefore calcium will be checked as well. Shortness of breath I will ask her to have echocardiogram done to check left ventricle ejection fraction doing this also for assessment of significance of PVCs. Diabetes recently recognized just recently put on insulin.  I am worried that maybe insulin together with high sugar make her potassium low.  Again we will check those elements and tomorrow she see her primary care physician. Essential hypertension blood pressure seems to be decently controlled right now.  Continue monitoring. I reviewed records from the hospital for this visit   Medication Adjustments/Labs and Tests Ordered: Current medicines are reviewed at length with the patient  today.  Concerns regarding medicines are outlined above.  Orders Placed This Encounter  Procedures   Comprehensive metabolic panel   Magnesium   LONG TERM MONITOR (3-14 DAYS)   ECHOCARDIOGRAM COMPLETE   No orders of the defined types were placed in this encounter.   Signed, Park Liter, MD, [facility]. 05/26/2022 5:03 PM    [place]

## 2022-05-27 DIAGNOSIS — E119 Type 2 diabetes mellitus without complications: Secondary | ICD-10-CM | POA: Diagnosis not present

## 2022-05-27 DIAGNOSIS — D518 Other vitamin B12 deficiency anemias: Secondary | ICD-10-CM | POA: Diagnosis not present

## 2022-05-27 DIAGNOSIS — E782 Mixed hyperlipidemia: Secondary | ICD-10-CM | POA: Diagnosis not present

## 2022-05-27 DIAGNOSIS — E038 Other specified hypothyroidism: Secondary | ICD-10-CM | POA: Diagnosis not present

## 2022-05-27 DIAGNOSIS — E559 Vitamin D deficiency, unspecified: Secondary | ICD-10-CM | POA: Diagnosis not present

## 2022-05-27 DIAGNOSIS — I10 Essential (primary) hypertension: Secondary | ICD-10-CM | POA: Diagnosis not present

## 2022-05-27 DIAGNOSIS — F419 Anxiety disorder, unspecified: Secondary | ICD-10-CM | POA: Diagnosis not present

## 2022-05-27 LAB — COMPREHENSIVE METABOLIC PANEL
ALT: 33 IU/L — ABNORMAL HIGH (ref 0–32)
AST: 24 IU/L (ref 0–40)
Albumin/Globulin Ratio: 1.4 (ref 1.2–2.2)
Albumin: 4.3 g/dL (ref 3.9–4.9)
Alkaline Phosphatase: 124 IU/L — ABNORMAL HIGH (ref 44–121)
BUN/Creatinine Ratio: 22 (ref 9–23)
BUN: 18 mg/dL (ref 6–24)
Bilirubin Total: 0.2 mg/dL (ref 0.0–1.2)
CO2: 27 mmol/L (ref 20–29)
Calcium: 9.6 mg/dL (ref 8.7–10.2)
Chloride: 95 mmol/L — ABNORMAL LOW (ref 96–106)
Creatinine, Ser: 0.81 mg/dL (ref 0.57–1.00)
Globulin, Total: 3.1 g/dL (ref 1.5–4.5)
Glucose: 88 mg/dL (ref 70–99)
Potassium: 3.4 mmol/L — ABNORMAL LOW (ref 3.5–5.2)
Sodium: 141 mmol/L (ref 134–144)
Total Protein: 7.4 g/dL (ref 6.0–8.5)
eGFR: 91 mL/min/{1.73_m2} (ref >59)

## 2022-05-27 LAB — MAGNESIUM: Magnesium: 1.8 mg/dL (ref 1.6–2.3)

## 2022-05-27 NOTE — Addendum Note (Signed)
Addended by: Roosvelt Harps R on: 05/27/2022 04:00 PM   Modules accepted: Orders

## 2022-06-01 DIAGNOSIS — R112 Nausea with vomiting, unspecified: Secondary | ICD-10-CM | POA: Diagnosis not present

## 2022-06-01 DIAGNOSIS — R531 Weakness: Secondary | ICD-10-CM | POA: Diagnosis not present

## 2022-06-01 DIAGNOSIS — F17210 Nicotine dependence, cigarettes, uncomplicated: Secondary | ICD-10-CM | POA: Diagnosis not present

## 2022-06-01 DIAGNOSIS — E876 Hypokalemia: Secondary | ICD-10-CM | POA: Diagnosis not present

## 2022-06-03 ENCOUNTER — Telehealth: Payer: Self-pay

## 2022-06-03 NOTE — Telephone Encounter (Signed)
Spoke with pt about lab results. She stated that she saw her PCP the day after she saw Dr. Bing Matter and he increased her Potassium to . Per Dr. Bing Matter advised to have blood work repeated in 1 week. Pt agreed and verbalized understanding. BMP order entered.

## 2022-06-08 DIAGNOSIS — Z5181 Encounter for therapeutic drug level monitoring: Secondary | ICD-10-CM | POA: Diagnosis not present

## 2022-06-08 DIAGNOSIS — F1190 Opioid use, unspecified, uncomplicated: Secondary | ICD-10-CM | POA: Diagnosis not present

## 2022-06-08 DIAGNOSIS — I499 Cardiac arrhythmia, unspecified: Secondary | ICD-10-CM | POA: Insufficient documentation

## 2022-06-08 DIAGNOSIS — M961 Postlaminectomy syndrome, not elsewhere classified: Secondary | ICD-10-CM | POA: Diagnosis not present

## 2022-06-09 ENCOUNTER — Ambulatory Visit: Payer: BC Managed Care – PPO | Attending: Cardiology

## 2022-06-09 DIAGNOSIS — R0602 Shortness of breath: Secondary | ICD-10-CM | POA: Diagnosis not present

## 2022-06-09 DIAGNOSIS — I493 Ventricular premature depolarization: Secondary | ICD-10-CM

## 2022-06-10 LAB — ECHOCARDIOGRAM COMPLETE
Area-P 1/2: 5.13 cm2
S' Lateral: 2.55 cm

## 2022-06-11 ENCOUNTER — Telehealth: Payer: Self-pay

## 2022-06-11 NOTE — Telephone Encounter (Signed)
-----   Message from Georgeanna Lea, MD sent at 06/11/2022  4:06 PM EST ----- Echocardiogram showed normal left ventricle ejection fraction, diastolic dysfunction otherwise looks fine

## 2022-06-11 NOTE — Telephone Encounter (Signed)
Patient notified of results.

## 2022-06-16 DIAGNOSIS — R0602 Shortness of breath: Secondary | ICD-10-CM | POA: Diagnosis not present

## 2022-06-16 DIAGNOSIS — I493 Ventricular premature depolarization: Secondary | ICD-10-CM | POA: Diagnosis not present

## 2022-07-14 ENCOUNTER — Telehealth: Payer: Self-pay

## 2022-07-14 MED ORDER — METOPROLOL SUCCINATE ER 50 MG PO TB24: 75.0000 mg | ORAL_TABLET | Freq: Every day | ORAL | 2 refills | Status: DC

## 2022-07-14 NOTE — Telephone Encounter (Signed)
-----  Message from Park Liter, MD sent at 07/11/2022  6:42 PM EST ----- Frequent ventricular ectopy.  I recommend to increase dose of metoprolol to 75 mg daily

## 2022-07-14 NOTE — Telephone Encounter (Signed)
Patient notified of results and recommendations and agreed with plan. Medication updated and sent to confirmed pharmacy.

## 2022-08-21 ENCOUNTER — Ambulatory Visit: Payer: BC Managed Care – PPO | Attending: Cardiology | Admitting: Cardiology

## 2022-08-27 ENCOUNTER — Encounter: Payer: Self-pay | Admitting: Cardiology

## 2022-10-28 ENCOUNTER — Other Ambulatory Visit: Payer: Self-pay | Admitting: Cardiology

## 2022-11-06 ENCOUNTER — Ambulatory Visit (INDEPENDENT_AMBULATORY_CARE_PROVIDER_SITE_OTHER): Payer: BC Managed Care – PPO

## 2022-11-06 ENCOUNTER — Ambulatory Visit: Payer: BC Managed Care – PPO | Admitting: Podiatry

## 2022-11-06 DIAGNOSIS — M7661 Achilles tendinitis, right leg: Secondary | ICD-10-CM | POA: Diagnosis not present

## 2022-11-06 DIAGNOSIS — R52 Pain, unspecified: Secondary | ICD-10-CM

## 2022-11-06 DIAGNOSIS — M7662 Achilles tendinitis, left leg: Secondary | ICD-10-CM | POA: Diagnosis not present

## 2022-11-06 DIAGNOSIS — M79672 Pain in left foot: Secondary | ICD-10-CM

## 2022-11-06 DIAGNOSIS — M7732 Calcaneal spur, left foot: Secondary | ICD-10-CM | POA: Diagnosis not present

## 2022-11-06 DIAGNOSIS — M7731 Calcaneal spur, right foot: Secondary | ICD-10-CM | POA: Diagnosis not present

## 2022-11-06 DIAGNOSIS — M7752 Other enthesopathy of left foot: Secondary | ICD-10-CM | POA: Diagnosis not present

## 2022-11-06 DIAGNOSIS — M79671 Pain in right foot: Secondary | ICD-10-CM

## 2022-11-06 MED ORDER — TRIAMCINOLONE ACETONIDE 10 MG/ML IJ SUSP
10.0000 mg | Freq: Once | INTRAMUSCULAR | Status: AC
Start: 2022-11-06 — End: 2022-11-06
  Administered 2022-11-06: 10 mg

## 2022-11-06 NOTE — Progress Notes (Signed)
Chief Complaint  Patient presents with   Foot Pain    Pain located in bilateral feet, calcaneous area, pain has been ongoing for years, prior treatment include night splint and boot     HPI: 47 y.o. female presenting today with c/o pain in the back of both heels.  This has been a chronic, but intermittent, problem for her.  The left is significantly more painful than the right today.  She has seen other providers for this in the past and states that the night splint has not been effective for her.  She did have some relief with immobilization but the pain returned.  She has taken anti-inflammatories.  She notes that back of the heel pain is fairly severe towards the end of her workday.  Denies feeling any snap or pop sensation to the area or sudden weakness with propulsion during gait.  Denies bruising.  Past Medical History:  Diagnosis Date   Bilateral plantar fasciitis 02/04/2021   Chronic, continuous use of opioids 07/08/2021   Last Assessment & Plan: Formatting of this note might be different from the original. Patient and I have discussed the hazardous effects of continued opiate pain medication usage. Risks and benefits of above medications including but not limited to possibility of hyperalgesia, respiratory depression, sedation, and even death were discussed with the patient who expressed an understanding. Patient i   Class 3 severe obesity due to excess calories with body mass index (BMI) of 45.0 to 49.9 in adult [facility]) 07/08/2021   Last Assessment & Plan: Formatting of this note might be different from the original. Patient educated about the detrimental effects of weight as it specifically pertains to pain management and overall health.  Patient's BMI 45.93 Encouraged healthy eating habits and routine low-impact cardiovascular exercises as tolerated.   Diabetes mellitus without complication (HCC)    Dyspnea    with exersion   Elevated blood pressure reading 07/08/2021   Last  Assessment & Plan: Formatting of this note might be different from the original. Blood pressure elevated in clinic today.  Discussed detrimental effects of uncontrolled blood pressure including heart attack, stroke, and possible kidney failure. Patient has had consistent elevated blood pressures over the last several visits with [place].  She continues to work with her PCP for ongoing management.    Financial difficulty 12/02/2021   Last Assessment & Plan: Formatting of this note might be different from the original. Recommend reaching back out to cares suite for assistance.  Telephone number provided.   Generalized anxiety disorder 12/08/2016   HNP (herniated nucleus pulposus), lumbar 10/22/2016   Hypertension    Ingrown nail 04/20/2017   Lumbar post-laminectomy syndrome 03/18/2021   Last Assessment & Plan: Formatting of this note might be different from the original. 47 year old female seen today in routine follow-up for chronic lower back pain with predominantly left lower extremity radicular pain.  Historically, she has complained of primarily right lower extremity radicular pain that improved following L5-S1 laminectomy.   Review of lumbar spine detailed in HPI.  Patient r   Lumbar radiculopathy 03/18/2021   Last Assessment & Plan: Formatting of this note might be different from the original. Continue gabapentin as directed.   Major depressive disorder, recurrent episode, severe (HCC) 12/08/2016   Obesity    Pain medication agreement 03/18/2021   Last Assessment & Plan: Formatting of this note might be different from the original. Agreement up-to-date. UDS to be collected in compliance with clinic policies and  procedures.  Previous UDS results reviewed. Patient did not display any signs of abuse or diversion and has been checked on the [facility] Controlled Substance website.   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 Discussed the detrimental effects of tobacco on bone and overall health.  Encourage smoking cessation.    Past Surgical History:  Procedure Laterality Date   CESAREAN SECTION     x2   CHOLECYSTECTOMY     LUMBAR LAMINECTOMY/DECOMPRESSION MICRODISCECTOMY Right 10/22/2016   Procedure: Right Lumbar Five-Sacral One Microdiscectomy;  Surgeon: Donalee Citrin, MD;  Location: [facility] OR;  Service: Neurosurgery;  Laterality: Right;   Mirena insertion     Vaginal   TUBAL LIGATION      Allergies  Allergen Reactions   Bee Pollen Swelling   Other Other (See Comments)    Ask   Latex Itching and Rash     Physical Exam: General: The patient is alert and oriented x3 in no acute distress.  Dermatology:  No ecchymosis, erythema, or edema bilateral.  No open lesions.    Vascular: Palpable pedal pulses bilaterally. Capillary refill within normal limits.  No appreciable edema.    Neurological: Light touch sensation intact bilateral.  MMT 5/5 to lower extremity bilateral. Negative Tinel's sign with percussion of the posterior tibial nerve on the affected extremity.    Musculoskeletal Exam:  There is pain on palpation of the posterior aspect of both heels.  There is also pain with side-to-side compression of the posterior superior aspect of the calcaneal tuberosity.  Slight enlargement of the morphology of the posterior calcaneus.  No palpable gaps or nodules noted within the achilles tendon.  Antalgic gait noted with first steps out of exam chair.  No pain on palpation of the plantar heel.  No ecchymosis or edema to posterior heel.  Radiographic Exam (bilateral foot x-rays 11/06/2022):  Normal osseous mineralization. Joint spaces preserved.  No fractures noted.  There is an inferior calcaneal spur noted on the left foot and  calcification within the Achilles tendon near its insertion on the left as well.  The soft tissue shadow of the Achilles tendon appears to be intact.  Assessment/Plan of Care: 1.  Bilateral Achilles tendinitis, left worse than right. 2.  Calcaneal spur left foot with Achilles tendon calcification. 3.  bursitis left posterior heel.  -Reviewed etiology of achilles tendonitis with patient.  Discussed treatment options with patient today, including cortisone injection, NSAID course of treatment, stretching exercises, use of night splint, physical therapy, rest, icing the heel, arch supports/orthotics, and supportive shoe gear.  Discussed massage of Voltaren gel into posterior heel twice daily.  Reviewed radiographic findings with the patient today.  Heel lifts were made to wear in her shoes bilateral.  The intent is to create a slight slack in the Achilles tendon to decrease pull and decrease discomfort.  With the patient's consent a corticosteroid injection was administered to the posterior aspect of the left heel at the Achilles tendon insertion.  This consisted of 1.25 cc of a mixture of 1% lidocaine plain, 0.5% Marcaine plain, and Kenalog 10.  She tolerated this well.  A Band-Aid was applied.  She will remove this within the next few hours.  Instructed the patient to  minimize any high impact activities or jumping activities for the next 48 hours.  She will wait until 48 hours from today to resume stretching exercises.  Discussed referral to physical therapy  Return in about 4 weeks (around 12/04/2022) for achilles tendonitis recheck.   Clerance Lav, DPM, FACFAS [facility]     [address] [place], [place] [address]                Office [phone]  Fax [phone]

## 2022-11-25 ENCOUNTER — Ambulatory Visit: Payer: BC Managed Care – PPO | Admitting: Podiatry

## 2022-11-25 DIAGNOSIS — M7661 Achilles tendinitis, right leg: Secondary | ICD-10-CM | POA: Diagnosis not present

## 2022-11-25 DIAGNOSIS — R252 Cramp and spasm: Secondary | ICD-10-CM

## 2022-11-25 DIAGNOSIS — B353 Tinea pedis: Secondary | ICD-10-CM | POA: Diagnosis not present

## 2022-11-25 MED ORDER — CLOTRIMAZOLE-BETAMETHASONE 1-0.05 % EX CREA
1.0000 | TOPICAL_CREAM | Freq: Every day | CUTANEOUS | 0 refills | Status: DC
Start: 2022-11-25 — End: 2023-02-28

## 2022-11-25 MED ORDER — TIZANIDINE HCL 4 MG PO TABS
4.0000 mg | ORAL_TABLET | Freq: Four times a day (QID) | ORAL | 0 refills | Status: DC | PRN
Start: 2022-11-25 — End: 2023-02-28

## 2022-11-25 MED ORDER — TRIAMCINOLONE ACETONIDE 10 MG/ML IJ SUSP
10.0000 mg | Freq: Once | INTRAMUSCULAR | Status: AC
Start: 2022-11-25 — End: 2022-11-25
  Administered 2022-11-25: 10 mg

## 2022-11-25 NOTE — Progress Notes (Signed)
Chief Complaint  Patient presents with   Foot Pain    Patient was previously seen on 11/06/2022 for left foot pain and steroid injection was given with relief. Patient in office with right heel pain radiating to her calf. Pain started x 1 week ago.     HPI: 47 y.o. female presenting today for follow-up of bilateral posterior heel pain.  At her most recent visit she had a corticosteroid injection administered to the posterior left heel.  She notes that the left heel actually feels fine today and is pleased.  However, her right posterior heel is quite painful today and she is getting frequent posterior muscle cramping in the calf and the thigh.  She was worried that she may have a blood clot due to the cramping and having a family member that this happened.  She also has been very busy with assisting her daughter with childbirth.  She also notes itching and peeling along the bottom of both feet and in between the toes.  She states that itching can get quite quite intense and wake her up in the middle the night.  Past Medical History:  Diagnosis Date   Bilateral plantar fasciitis 02/04/2021   Chronic, continuous use of opioids 07/08/2021   Last Assessment & Plan: Formatting of this note might be different from the original. Patient and I have discussed the hazardous effects of continued opiate pain medication usage. Risks and benefits of above medications including but not limited to possibility of hyperalgesia, respiratory depression, sedation, and even death were discussed with the patient who expressed an understanding. Patient i   Class 3 severe obesity due to excess calories with body mass index (BMI) of 45.0 to 49.9 in adult [facility]) 07/08/2021   Last Assessment & Plan: Formatting of this note might be different from the original. Patient educated about the detrimental effects of weight as it specifically pertains to pain management and overall health.  Patient's BMI 45.93 Encouraged  healthy eating habits and routine low-impact cardiovascular exercises as tolerated.   Diabetes mellitus without complication (HCC)    Dyspnea    with exersion   Elevated blood pressure reading 07/08/2021   Last Assessment & Plan: Formatting of this note might be different from the original. Blood pressure elevated in clinic today.  Discussed detrimental effects of uncontrolled blood pressure including heart attack, stroke, and possible kidney failure. Patient has had consistent elevated blood pressures over the last several visits with [place].  She continues to work with her PCP for ongoing management.    Financial difficulty 12/02/2021   Last Assessment & Plan: Formatting of this note might be different from the original. Recommend reaching back out to cares suite for assistance.  Telephone number provided.   Generalized anxiety disorder 12/08/2016   HNP (herniated nucleus pulposus), lumbar 10/22/2016   Hypertension    Ingrown nail 04/20/2017   Lumbar post-laminectomy syndrome 03/18/2021   Last Assessment & Plan: Formatting of this note might be different from the original. 47 year old female seen today in routine follow-up for chronic lower back pain with predominantly left lower extremity radicular pain.  Historically, she has complained of primarily right lower extremity radicular pain that improved following L5-S1 laminectomy.   Review of lumbar spine detailed in HPI.  Patient r   Lumbar radiculopathy 03/18/2021   Last Assessment & Plan: Formatting of this note might be different from the original. Continue gabapentin as directed.   Major depressive disorder, recurrent episode, severe (HCC)  12/08/2016   Obesity    Pain medication agreement 03/18/2021   Last Assessment & Plan: Formatting of this note might be different from the original. Agreement up-to-date. UDS to be collected in compliance with clinic policies and procedures.  Previous UDS results reviewed. Patient did not display any signs of  abuse or diversion and has been checked on the [facility] Controlled Substance website.   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 Discussed the detrimental effects of tobacco on bone and overall health.  Encourage smoking cessation.    Past Surgical History:  Procedure Laterality Date   CESAREAN SECTION     x2   CHOLECYSTECTOMY     LUMBAR LAMINECTOMY/DECOMPRESSION MICRODISCECTOMY Right 10/22/2016   Procedure: Right Lumbar Five-Sacral One Microdiscectomy;  Surgeon: Donalee Citrin, MD;  Location: [facility] OR;  Service: Neurosurgery;  Laterality: Right;   Mirena insertion     Vaginal   TUBAL LIGATION      Allergies  Allergen Reactions   Bee Pollen Swelling   Other Other (See Comments)    Ask   Latex Itching and Rash     Physical Exam: General: The patient is alert and oriented x3 in no acute distress.  Dermatology:  No ecchymosis, erythema, or edema bilateral.  No open lesions.  There is diffuse dry skin and peeling along the plantar aspect of both feet with some peeling in the interspaces as well.  No vesicles noted.  Vascular: Palpable pedal pulses bilaterally. Capillary refill within normal limits.  No appreciable edema.  Proximal to distal cooling is within normal limits bilateral legs  Neurological: Light touch sensation intact bilateral.  MMT 5/5 to lower extremity bilateral. Negative Tinel's sign with percussion of the posterior tibial nerve on the affected extremity.    Musculoskeletal Exam:  There is pain on palpation of the posterior aspect of the right heel.  No palpable gaps or nodules noted within the achilles tendon.  Antalgic gait noted with first steps out of exam chair.  No pain on palpation of the plantar heel.  No  ecchymosis or edema to posterior heel.  There is tightness to the posterior calf musculature on the right side only, which could be from the frequent cramps that she has been experiencing.  There is no increase in circumference of one leg versus the other or any increase in warmth.  Assessment/Plan of Care: 1. Muscle cramp   2. Tinea pedis of both feet     Meds ordered this encounter  Medications   clotrimazole-betamethasone (LOTRISONE) cream    Sig: Apply 1 Application topically daily.    Dispense:  30 g    Refill:  0   tiZANidine (ZANAFLEX) 4 MG tablet    Sig: Take 1 tablet (4 mg total) by mouth every 6 (six) hours as needed for muscle spasms.    Dispense:  30 tablet    Refill:  0   Discussed administration of a corticosteroid injection into the posterior right heel since she did so well with the left.  She was amenable to this.  After a sterile prep to the skin on the posterior heel at the insertion of the Achilles into the calcaneus proximately 1 cc of a mixture of 1% lidocaine plain  0.5% bupivacaine plain and 10 mg of Kenalog were administered.  She tolerated this well.  A Band-Aid was applied.  She was instructed to cut back on any high-impact activities for the next 48 hours so that she does not place extra strain on the tendon.  Prescribed tizanidine, a muscle relaxer, 4 mg that she can take 1 tablet every 6 hours as needed for the muscle spasms and cramps.  Regarding the tinea pedis bilateral, prescription for Lotrisone cream was sent to her pharmacy to help with the itching and the fungal infection.  She will need to use this twice daily for 3 to 4 weeks.  She can call if she needs any refills.  Return in about 4 weeks (around 12/23/2022) for heel pain recheck / achilles.   Clerance Lav, DPM, FACFAS [facility]     [address] [place], [place] [address]                Office [phone]  Fax [phone]

## 2022-11-26 ENCOUNTER — Other Ambulatory Visit: Payer: Self-pay | Admitting: Cardiology

## 2022-12-23 ENCOUNTER — Encounter (INDEPENDENT_AMBULATORY_CARE_PROVIDER_SITE_OTHER): Payer: BC Managed Care – PPO | Admitting: Podiatry

## 2022-12-23 DIAGNOSIS — Z91199 Patient's noncompliance with other medical treatment and regimen due to unspecified reason: Secondary | ICD-10-CM

## 2022-12-26 NOTE — Progress Notes (Signed)
Patient was a no-show for today's scheduled appointment.

## 2023-02-28 ENCOUNTER — Other Ambulatory Visit: Payer: Self-pay | Admitting: Podiatry

## 2023-02-28 DIAGNOSIS — B353 Tinea pedis: Secondary | ICD-10-CM

## 2023-02-28 DIAGNOSIS — R252 Cramp and spasm: Secondary | ICD-10-CM

## 2023-10-15 ENCOUNTER — Other Ambulatory Visit: Payer: Self-pay | Admitting: Cardiology

## 2023-12-01 ENCOUNTER — Encounter: Payer: Self-pay | Admitting: Podiatry

## 2023-12-01 ENCOUNTER — Ambulatory Visit: Admitting: Podiatry

## 2023-12-01 DIAGNOSIS — L600 Ingrowing nail: Secondary | ICD-10-CM

## 2023-12-01 DIAGNOSIS — B351 Tinea unguium: Secondary | ICD-10-CM | POA: Diagnosis not present

## 2023-12-01 NOTE — Progress Notes (Signed)
       Subjective:  Patient ID: Jean Henderson, female    DOB: 03-Sep-1975,  MRN: [id number]  Jean Henderson presents to clinic today for:  Chief Complaint  Patient presents with   Nail Problem    Left first nail, she has had it removed previously and it has grown back and it very thick and growing upwards. It is painful even for the sheets to touch it. Last A1c was 6.3 on 5/12, takes ASA. She is open to do what ever you think is best, weather trimming or removing it.    Patient presents with the above concern.  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  She is on her feet a lot.  Allergies  Allergen Reactions   Bee Pollen Swelling   Other Other (See Comments)    Ask   Latex Itching and Rash    Objective:  Jean Henderson is a pleasant 48 y.o. female in NAD. AAO x 3.  Vascular Examination: Capillary refill time is 3-5 seconds to toes bilateral. Palpable pedal pulses b/l LE. Digital hair present b/l. No pedal edema b/l. Skin temperature gradient WNL b/l. No varicosities b/l. No cyanosis or clubbing noted b/l.   Dermatological Examination: There is incurvation of the left hallux nail borders, secondary to central thickening of the nail due to fungal nail involvement..  There is pain on palpation of the affected nail borders.  Any light touch to the toenail reproduces symptoms.  No drainage is observed.  No signs of paronychia noted.  Neurological Examination: Epicritic sensation is intact to the toes.  Assessment/Plan: 1. Fungal nail infection   2. Ingrown toenail     Discussed patient's condition today.  After presenting patient with all treatment options, she would like to have the nail temporarily removed and all so that she can start fungal nail treatment as the new nail grows in.    After obtaining patient consent, the left hallux was anesthetized with a 50:50 mixture of 1% lidocaine  plain and 0.5% bupivacaine  plain for a total of 3cc's administered.  Upon confirmation  of anesthesia, a freer elevator was utilized to free the left hallux toenail from the nail bed.  The nail  was then avulsed proximal to the eponychium and removed in toto.  The area was inspected for any remaining spicules.  The toenail was sent, in toto, 2 stages labs for fungal nail culture.  Antibiotic ointment and a DSD were applied, followed by a Coban dressing.  Patient tolerated the anesthetic and procedure well and will f/u in 2-3 weeks for recheck.  Patient given post-procedure instructions for daily 15-minute Epsom salt soaks, antibiotic ointment and daily use of Bandaids until toe starts to dry / form eschar.    Return in about 2 weeks (around 12/15/2023) for nail avulsion recheck L-1.  Will discuss fungal treatment options at that time.  Hopefully the fungal nail culture results will be back by then.   Joe Murders, DPM, FACFAS [facility]     [address] [place], [place] [address]                Office [phone]  Fax [phone]

## 2023-12-01 NOTE — Patient Instructions (Signed)

## 2023-12-15 ENCOUNTER — Ambulatory Visit: Admitting: Podiatry

## 2023-12-22 ENCOUNTER — Ambulatory Visit: Payer: Self-pay | Admitting: Podiatry

## 2023-12-24 ENCOUNTER — Ambulatory Visit (INDEPENDENT_AMBULATORY_CARE_PROVIDER_SITE_OTHER): Admitting: Podiatry

## 2023-12-24 DIAGNOSIS — B351 Tinea unguium: Secondary | ICD-10-CM | POA: Diagnosis not present

## 2023-12-24 MED ORDER — TERBINAFINE HCL 250 MG PO TABS: 250.0000 mg | ORAL_TABLET | Freq: Every day | ORAL | 0 refills | Status: AC

## 2023-12-24 NOTE — Progress Notes (Unsigned)
    Subjective:  Patient ID: Jean Henderson, female    DOB: 02-03-1976,  MRN: [id number]  Chief Complaint  Patient presents with   Nail Check    Left 1st. Rechecking today. Pain has resolved. Last A1c was 6.3, takes ASA.    Jean Henderson presents to clinic today for f/u of avulsion to the left great toenail.  She is having no pain or drainage at this time.  She is also here for review of her fungal nail culture results  Allergies  Allergen Reactions   Bee Pollen Swelling   Other Other (See Comments)    Ask   Latex Itching and Rash    Objective:  There were no vitals filed for this visit.  Vascular Examination: Capillary refill time is 3-5 seconds to toes bilateral. Palpable pedal pulses b/l LE. Digital hair present b/l. No pedal edema b/l. Skin temperature gradient WNL b/l. No varicosities b/l. No cyanosis or clubbing noted b/l.   Dermatological Examination: Upon inspection of the PNA site, there are no clinical signs of infection.  No purulence, no necrosis, no malodor present.  Minimal to no erythema present.  Eschar formed along nail margin.  Minimal to no pain on palpation of area.   Assessment/Plan: 1. Fungal nail infection     Meds ordered this encounter  Medications   terbinafine (LAMISIL) 250 MG tablet    Sig: Take 1 tablet (250 mg total) by mouth daily.    Dispense:  90 tablet    Refill:  0   Reviewed the fungal nail culture results which were positive for nail fungus.  Reviewed her previous liver function test results which were normal.  It is safe to proceed with prescribing oral terbinafine 250 mg 1 tablet p.o. daily x 90 days.  Will follow-up in about 3 months to recheck the new nail growth.  Return in about 3 months (around 03/25/2024) for fungal nail recheck.   Jean Henderson, DPM, FACFAS [facility]     [address] [place], [place] [address]                Office [phone]  Fax [phone]

## 2024-03-24 ENCOUNTER — Ambulatory Visit (INDEPENDENT_AMBULATORY_CARE_PROVIDER_SITE_OTHER): Admitting: Podiatry

## 2024-03-24 DIAGNOSIS — B351 Tinea unguium: Secondary | ICD-10-CM

## 2024-03-24 NOTE — Progress Notes (Signed)
 Subjective:  Patient ID: Jean Henderson, female    DOB: March 01, 1976,  MRN: [id number]  Jean Henderson presents to clinic today for fungal nail check.  She is almost finished with the oral terbinafine .  Denies any side effects or adverse reactions.  Past Medical History:  Diagnosis Date   Bilateral plantar fasciitis 02/04/2021   Chronic, continuous use of opioids 07/08/2021   Last Assessment & Plan: Formatting of this note might be different from the original. Patient and I have discussed the hazardous effects of continued opiate pain medication usage. Risks and benefits of above medications including but not limited to possibility of hyperalgesia, respiratory depression, sedation, and even death were discussed with the patient who expressed an understanding. Patient i   Class 3 severe obesity due to excess calories with body mass index (BMI) of 45.0 to 49.9 in adult [facility]) 07/08/2021   Last Assessment & Plan: Formatting of this note might be different from the original. Patient educated about the detrimental effects of weight as it specifically pertains to pain management and overall health.  Patient's BMI 45.93 Encouraged healthy eating habits and routine low-impact cardiovascular exercises as tolerated.   Diabetes mellitus without complication (HCC)    Dyspnea    with exersion   Elevated blood pressure reading 07/08/2021   Last Assessment & Plan: Formatting of this note might be different from the original. Blood pressure elevated in clinic today.  Discussed detrimental effects of uncontrolled blood pressure including heart attack, stroke, and possible kidney failure. Patient has had consistent elevated blood pressures over the last several visits with us .  She continues to work with her PCP for ongoing management.    Financial difficulty 12/02/2021   Last Assessment & Plan: Formatting of this note might be different from the original. Recommend reaching back out to cares suite for  assistance.  Telephone number provided.   Generalized anxiety disorder 12/08/2016   HNP (herniated nucleus pulposus), lumbar 10/22/2016   Hypertension    Ingrown nail 04/20/2017   Lumbar post-laminectomy syndrome 03/18/2021   Last Assessment & Plan: Formatting of this note might be different from the original. 48 year old female seen today in routine follow-up for chronic lower back pain with predominantly left lower extremity radicular pain.  Historically, she has complained of primarily right lower extremity radicular pain that improved following L5-S1 laminectomy.   Review of lumbar spine detailed in HPI.  Patient r   Lumbar radiculopathy 03/18/2021   Last Assessment & Plan: Formatting of this note might be different from the original. Continue gabapentin  as directed.   Major depressive disorder, recurrent episode, severe (HCC) 12/08/2016   Obesity    Pain medication agreement 03/18/2021   Last Assessment & Plan: Formatting of this note might be different from the original. Agreement up-to-date. UDS to be collected in compliance with clinic policies and procedures.  Previous UDS results reviewed. Patient did not display any signs of abuse or diversion and has been checked on the [place]  Controlled Substance website.   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 Discussed the detrimental effects of tobacco on bone and overall health.  Encourage smoking cessation.   Past Surgical History:  Procedure Laterality Date   CESAREAN SECTION  x2   CHOLECYSTECTOMY     LUMBAR LAMINECTOMY/DECOMPRESSION MICRODISCECTOMY Right 10/22/2016   Procedure: Right Lumbar Five-Sacral One Microdiscectomy;  Surgeon: Arley Helling, MD;  Location: [facility] OR;   Service: Neurosurgery;  Laterality: Right;   Mirena insertion     Vaginal   TUBAL LIGATION     Allergies  Allergen Reactions   Bee Pollen Swelling   Other Other (See Comments)    Ask   Latex Itching and Rash    Review of Systems: Negative except as noted in the HPI.  Objective:  Vascular Examination: Capillary refill time is 3-5 seconds to toes bilateral. Palpable pedal pulses b/l LE. Digital hair present b/l.    Dermatological Examination: Pedal skin with normal turgor, texture and tone b/l. No open wounds. No interdigital macerations b/l.  The left hallux toenail is 3mm thick, discolored, dystrophic with subungual debris. There is pain with compression of the nail plates.  There is proximal clearing with the new nail growth coming in for approximately 15%.  Assessment/Plan: 1. Fungal nail infection    The mycotic toenails were sharply debrided, on the left hallux nail, with sterile nail nippers and a power debriding burr to decrease bulk/thickness and length.    Patient to continue with current treatment plan for the fungal nails as there is improvement noted.  She will finish out the oral terbinafine  250 mg 1 tablet p.o. daily x 90 days.  Once complete, she was informed that this will remain in the soft tissues for up to 1 year and continue working on the new nail growth.  Follow-up in 3 to 4 months   Carma Dwiggins D. Yilia Sacca, DPM, FACFAS [facility]     [address] [place], [place] [address]                Office [phone]  Fax [phone]

## 2024-03-26 ENCOUNTER — Encounter (HOSPITAL_BASED_OUTPATIENT_CLINIC_OR_DEPARTMENT_OTHER): Payer: Self-pay | Admitting: Emergency Medicine

## 2024-03-26 ENCOUNTER — Emergency Department (HOSPITAL_BASED_OUTPATIENT_CLINIC_OR_DEPARTMENT_OTHER)
Admission: EM | Admit: 2024-03-26 | Discharge: 2024-03-26 | Disposition: A | Discharge status: Home / Self Care | Payer: Self-pay | Attending: Emergency Medicine | Admitting: Emergency Medicine

## 2024-03-26 ENCOUNTER — Other Ambulatory Visit: Payer: Self-pay

## 2024-03-26 DIAGNOSIS — R1030 Lower abdominal pain, unspecified: Secondary | ICD-10-CM | POA: Insufficient documentation

## 2024-03-26 DIAGNOSIS — Z7982 Long term (current) use of aspirin: Secondary | ICD-10-CM | POA: Insufficient documentation

## 2024-03-26 DIAGNOSIS — Z91040 Latex allergy status: Secondary | ICD-10-CM | POA: Insufficient documentation

## 2024-03-26 DIAGNOSIS — E876 Hypokalemia: Secondary | ICD-10-CM | POA: Diagnosis not present

## 2024-03-26 DIAGNOSIS — R7401 Elevation of levels of liver transaminase levels: Secondary | ICD-10-CM | POA: Insufficient documentation

## 2024-03-26 DIAGNOSIS — R110 Nausea: Secondary | ICD-10-CM | POA: Insufficient documentation

## 2024-03-26 DIAGNOSIS — K5900 Constipation, unspecified: Secondary | ICD-10-CM | POA: Insufficient documentation

## 2024-03-26 LAB — CBC WITH DIFFERENTIAL/PLATELET
Abs Immature Granulocytes: 0.03 K/uL (ref 0.00–0.07)
Basophils Absolute: 0 K/uL (ref 0.0–0.1)
Basophils Relative: 0 %
Eosinophils Absolute: 0.1 K/uL (ref 0.0–0.5)
Eosinophils Relative: 1 %
HCT: 37.2 % (ref 36.0–46.0)
Hemoglobin: 13.2 g/dL (ref 12.0–15.0)
Immature Granulocytes: 0 %
Lymphocytes Relative: 35 %
Lymphs Abs: 2.8 K/uL (ref 0.7–4.0)
MCH: 31.7 pg (ref 26.0–34.0)
MCHC: 35.5 g/dL (ref 30.0–36.0)
MCV: 89.4 fL (ref 80.0–100.0)
Monocytes Absolute: 0.9 K/uL (ref 0.1–1.0)
Monocytes Relative: 11 %
Neutro Abs: 4.3 K/uL (ref 1.7–7.7)
Neutrophils Relative %: 53 %
Platelets: 225 K/uL (ref 150–400)
RBC: 4.16 MIL/uL (ref 3.87–5.11)
RDW: 12.8 % (ref 11.5–15.5)
WBC: 8.1 K/uL (ref 4.0–10.5)
nRBC: 0 % (ref 0.0–0.2)

## 2024-03-26 LAB — COMPREHENSIVE METABOLIC PANEL WITH GFR
ALT: 48 U/L — ABNORMAL HIGH (ref 0–44)
AST: 27 U/L (ref 15–41)
Albumin: 3.8 g/dL (ref 3.5–5.0)
Alkaline Phosphatase: 106 U/L (ref 38–126)
Anion gap: 12 (ref 5–15)
BUN: 17 mg/dL (ref 6–20)
CO2: 27 mmol/L (ref 22–32)
Calcium: 9.1 mg/dL (ref 8.9–10.3)
Chloride: 102 mmol/L (ref 98–111)
Creatinine, Ser: 0.83 mg/dL (ref 0.44–1.00)
GFR, Estimated: >60 mL/min (ref >60)
Glucose, Bld: 96 mg/dL (ref 70–99)
Potassium: 3.2 mmol/L — ABNORMAL LOW (ref 3.5–5.1)
Sodium: 141 mmol/L (ref 135–145)
Total Bilirubin: 0.4 mg/dL (ref 0.0–1.2)
Total Protein: 7.2 g/dL (ref 6.5–8.1)

## 2024-03-26 LAB — URINALYSIS, ROUTINE W REFLEX MICROSCOPIC
Bilirubin Urine: NEGATIVE
Glucose, UA: >=500 mg/dL — AB
Hgb urine dipstick: NEGATIVE
Ketones, ur: NEGATIVE mg/dL
Leukocytes,Ua: NEGATIVE
Nitrite: NEGATIVE
Protein, ur: 30 mg/dL — AB
Specific Gravity, Urine: >=1.03 (ref 1.005–1.030)
pH: 6 (ref 5.0–8.0)

## 2024-03-26 LAB — URINALYSIS, MICROSCOPIC (REFLEX): RBC / HPF: NONE SEEN RBC/hpf (ref 0–5)

## 2024-03-26 LAB — LIPASE, BLOOD: Lipase: 35 U/L (ref 11–51)

## 2024-03-26 LAB — PREGNANCY, URINE: Preg Test, Ur: NEGATIVE

## 2024-03-26 MED ORDER — POTASSIUM CHLORIDE CRYS ER 20 MEQ PO TBCR
40.0000 meq | EXTENDED_RELEASE_TABLET | Freq: Once | ORAL | Status: DC
  Filled 2024-03-26: qty 2

## 2024-03-26 MED ORDER — POTASSIUM CHLORIDE 10 MEQ/100ML IV SOLN: 10.0000 meq | Freq: Once | INTRAVENOUS | Status: DC

## 2024-03-26 MED ORDER — SODIUM CHLORIDE 0.9 % IV BOLUS
1000.0000 mL | Freq: Once | INTRAVENOUS | Status: AC
  Administered 2024-03-26: 1000 mL via INTRAVENOUS

## 2024-03-26 MED ORDER — POTASSIUM CHLORIDE 20 MEQ PO PACK
40.0000 meq | PACK | Freq: Every day | ORAL | Status: DC
  Administered 2024-03-26: 40 meq via ORAL
  Filled 2024-03-26: qty 2

## 2024-03-26 MED ORDER — ONDANSETRON HCL 4 MG/2ML IJ SOLN
4.0000 mg | Freq: Once | INTRAMUSCULAR | Status: AC
Start: 2024-03-26 — End: 2024-03-26
  Administered 2024-03-26: 4 mg via INTRAVENOUS
  Filled 2024-03-26: qty 2

## 2024-03-26 NOTE — ED Notes (Signed)
 ED Provider at bedside.

## 2024-03-26 NOTE — ED Notes (Addendum)
 Provided Ginger Ale for po challenge; pt sts she is unable to take K+ d/t size of the tablet; EDP notified

## 2024-03-26 NOTE — ED Provider Notes (Signed)
 [place] [facility] Provider Note   CSN: [id number] Arrival date & time: 03/26/24  9144     Patient presents with: Abdominal Pain   Jean Henderson is a 48 y.o. female.   48 year old female presenting with nausea.  Patient reports that symptoms and ongoing for approximately 1 week, she endorses daily nausea that has limited her p.o. intake, however no vomiting.  She reports occasional abdominal pain, none presently.  She notes that she is on oxycodone  and gabapentin  for management of her chronic back pain, and this often causes her to be constipated, her last bowel movement was this morning and was dark, however she believes that this is because she has been taking Pepto-Bismol.  She typically has a bowel movement approximately every 3 days, this week she has been using magnesium citrate/Pepto-Bismol for constipation.  She reports some discomfort with her last bowel movement, believes that she gave myself a hemorrhoid.  She did note bright red blood on the toilet paper this morning after her bowel movement, none in the stool/toilet.  She reports feeling swimmy headed at times when she stands.  She was seen at urgent care on Tuesday, was told that her symptoms may be due to a viral illness, was given Zofran  however notes that this has not been alleviating her nausea.  On aspirin , no other blood thinners.  History of cholecystectomy, C-section x 2.  Denies diarrhea, fever, dysuria, chest pain, shortness of breath.   Abdominal Pain      Prior to Admission medications   Medication Sig Start Date End Date Taking? Authorizing Provider  ACCU-CHEK GUIDE test strip 1 each by Other route as needed for other (Glucose check). 01/11/20   [provider]  acetaminophen  (TYLENOL ) 500 MG tablet Take 1,000 mg by mouth every 6 (six) hours as needed for moderate pain or headache.    [provider]  amLODipine  (NORVASC ) 2.5 MG tablet Take 2.5 mg by  mouth daily. 12/01/19   [provider]  amLODipine -valsartan  (EXFORGE ) 10-320 MG tablet Take 1 tablet by mouth daily.    [provider]  aspirin  81 MG chewable tablet Chew 81 mg by mouth daily.    [provider]  Blood Glucose Monitoring Suppl (ACCU-CHEK GUIDE ME) w/Device KIT 1 each by Other route as directed. Glucose check 01/04/20   [provider]  calcium  carbonate (OS-CAL - DOSED IN MG OF ELEMENTAL CALCIUM ) 1250 (500 Ca) MG tablet Take 1 tablet by mouth daily with breakfast.    [provider]  clonazePAM (KLONOPIN) 1 MG tablet Take 1 mg by mouth daily.    [provider]  clotrimazole -betamethasone  (LOTRISONE ) cream Apply 1 application topically 2 (two) times daily.    [provider]  clotrimazole -betamethasone  (LOTRISONE ) cream APPLY 1 APPLICATION TOPICALLY DAILY 02/28/23   Standiford, Marsa FALCON, DPM  diclofenac  (VOLTAREN ) 50 MG EC tablet Take 1 tablet (50 mg total) by mouth 3 (three) times daily. Start the day after completion of the steroid pack 01/16/20   Gretel Ozell PARAS, DPM  DULoxetine (CYMBALTA) 60 MG capsule Take 60 mg by mouth 2 (two) times daily. 01/03/20   [provider]  Exenatide  ER (BYDUREON ) 2 MG PEN Inject 2 mg into the skin every Saturday.    [provider]  gabapentin  (NEURONTIN ) 300 MG capsule Take 1 capsule (300 mg total) by mouth 3 (three) times daily. Patient taking differently: Take 600 mg by mouth 3 (three) times daily. 06/28/16   Odis,  Burnard Jansky, PA-C  ibuprofen  (ADVIL ,MOTRIN ) 200 MG tablet Take 800 mg by mouth every 6 (six) hours as needed for moderate pain.     [provider]  loratadine  (CLARITIN ) 10 MG tablet Take 10 mg by mouth daily.    [provider]  metFORMIN  (GLUCOPHAGE ) 500 MG tablet Take 500 mg by mouth 2 (two) times daily with a meal.    [provider]  methylPREDNISolone  (MEDROL  DOSEPAK) 4 MG TBPK tablet 6 Day Taper Pack. Take as  Directed. Patient taking differently: Take 4 mg by mouth as needed. 6 Day Taper Pack. Take as Directed. 01/16/20   Gretel Ozell PARAS, DPM  metoprolol  succinate (TOPROL -XL) 50 MG 24 hr tablet TAKE 1 AND 1/2 TABLETS BY MOUTH DAILY 10/15/23   Krasowski, Robert J, MD  oxyCODONE  (OXY IR/ROXICODONE ) 5 MG immediate release tablet Take 1-2 tablets (5-10 mg total) by mouth every 3 (three) hours as needed for breakthrough pain. 10/22/16   Onetha Kuba, MD  potassium chloride  (K-DUR,KLOR-CON ) 10 MEQ tablet Take 10 mEq by mouth daily.     [provider]  terbinafine  (LAMISIL ) 250 MG tablet Take 1 tablet (250 mg total) by mouth daily. 12/24/23   McCaughan, Dia D, DPM  tiZANidine  (ZANAFLEX ) 4 MG tablet TAKE 1 TABLET BY MOUTH EVERY 6 HOURS AS NEEDED FOR MUSCLE SPASMS. 02/28/23   Standiford, Marsa FALCON, DPM  venlafaxine  (EFFEXOR ) 75 MG tablet Take 75 mg by mouth daily.    [provider]  vitamin B-12 (CYANOCOBALAMIN ) 1000 MCG tablet Take 2,000 mcg by mouth daily.    [provider]  Vitamin D , Ergocalciferol , (DRISDOL ) 50000 units CAPS capsule Take 50,000 Units by mouth every Saturday.    [provider]    Allergies: Bee pollen, Other, and Latex    Review of Systems  Gastrointestinal:  Positive for abdominal pain.    Updated Vital Signs  Vitals:   03/26/24 0900  BP: (!) 151/106  Pulse: 90  Resp: 20  Temp: 98.6 F (37 C)  TempSrc: Oral  SpO2: 100%     Physical Exam Vitals and nursing note reviewed.  HENT:     Head: Normocephalic.  Eyes:     Extraocular Movements: Extraocular movements intact.  Cardiovascular:     Rate and Rhythm: Normal rate and regular rhythm.     Pulses:          Radial pulses are 2+ on the right side and 2+ on the left side.       Dorsalis pedis pulses are 2+ on the right side and 2+ on the left side.     Heart sounds: Normal heart sounds.  Pulmonary:     Effort: Pulmonary effort is normal.     Breath sounds: Normal breath sounds.   Abdominal:     Palpations: Abdomen is soft.     Tenderness: There is abdominal tenderness (mild, suprapubic). There is no guarding.  Musculoskeletal:     Cervical back: Normal range of motion.     Right lower leg: No edema.     Left lower leg: No edema.     Comments: Moves all extremities spontaneously without difficulty  Skin:    General: Skin is warm and dry.  Neurological:     Mental Status: She is alert and oriented to person, place, and time.     (all labs ordered are listed, but only abnormal results are displayed) Labs Reviewed  URINALYSIS, ROUTINE W REFLEX MICROSCOPIC - Abnormal; Notable for the following components:  Result Value   Glucose, UA >=500 (*)    Protein, ur 30 (*)    All other components within normal limits  COMPREHENSIVE METABOLIC PANEL WITH GFR - Abnormal; Notable for the following components:   Potassium 3.2 (*)    ALT 48 (*)    All other components within normal limits  URINALYSIS, MICROSCOPIC (REFLEX) - Abnormal; Notable for the following components:   Bacteria, UA MANY (*)    All other components within normal limits  URINE CULTURE  PREGNANCY, URINE  LIPASE, BLOOD  CBC WITH DIFFERENTIAL/PLATELET  CBG MONITORING, ED    EKG: EKG Interpretation Date/Time:  Sunday March 26 2024 09:28:36 EDT Ventricular Rate:  89 PR Interval:  164 QRS Duration:  80 QT Interval:  391 QTC Calculation: 476 R Axis:   -5  Text Interpretation: Sinus rhythm Left atrial enlargement No significant change since last tracing Confirmed by Dreama Longs ([id number]) on 03/26/2024 9:41:25 AM  Radiology: No results found.   Procedures   Medications Ordered in the ED  potassium chloride  (KLOR-CON ) packet 40 mEq (has no administration in time range)  sodium chloride  0.9 % bolus 1,000 mL ( Intravenous Stopped 03/26/24 1106)  ondansetron  (ZOFRAN ) injection 4 mg (4 mg Intravenous Given 03/26/24 1003)                                    Medical Decision Making This  patient presents to the ED for concern of nausea/constipation/abdominal pain, this involves an extensive number of treatment options, and is a complaint that carries with it a high risk of complications and morbidity.  The differential diagnosis includes constipation due to chronic opioid use, constipation secondary to dehydration, constipation secondary to diet, gastroparesis, pancreatitis, gastroenteritis, appendicitis, diverticulitis   Co morbidities that complicate the patient evaluation  Type 2 diabetes, chronic back pain managed with opioids   Additional history obtained:  Additional history obtained from record review External records from outside source obtained and reviewed including prior PCP note   Lab Tests:  I Ordered, and personally interpreted labs.  The pertinent results include: CBC within normal limits, no leukocytosis, hemoglobin 13.2.  CMP notable for mild elevation in ALT at 48, hypokalemia with potassium of 3.2.  Lipase within normal limits.  Urine hCG negative.  Urinalysis notable for glucosuria/protein, many bacteria noted on microscopy however leukocytes/nitrites negative, no urinary symptoms however patient does endorse suprapubic tenderness on exam, will send for urine culture.   Cardiac Monitoring: / EKG:  The patient was maintained on a cardiac monitor.  I personally viewed and interpreted the cardiac monitored which showed an underlying rhythm of: NSR  Problem List / ED Course / Critical interventions / Medication management  I ordered medication including Zofran  for nausea, IV fluids for dehydration, PO potassium for hypokalemia Reevaluation of the patient after these medicines showed that the patient improved I have reviewed the patients home medicines and have made adjustments as needed   Social Determinants of Health:  Tobacco use   Test / Admission - Considered:  Physical exam largely unremarkable as above, patient does have mild tenderness to  palpation in the suprapubic region however no guarding/rigidity.  She is afebrile, hypertensive but no tachycardia, normal oxygen saturation on RA. Labs are notable as above.  Given reassuring physical exam findings, I do not feel that imaging is necessary at this time. Patient notes that she is on Mounjaro and Farxiga for control of  her diabetes, she is also on oxycodone /gabapentin  for management of chronic back pain.  I suspect that her symptoms today are likely secondary to side effects of these medications, including nausea/constipation, patient did note blood on toilet paper after bowel movement today, I offered to perform DRE to assess for blood in her stool however she declines, I feel that this is reasonable, hemoglobin reassuring as above.   Patient also admits that she barely drinks water during the day, I recommend that she increase her fluid intake to prevent worsening constipation.  I recommend MiraLAX/docusate for constipation, I also recommend that she discuss her medications/constipation/nausea with her PCP, as adjustments in her medications may be necessary to manage the symptoms.  She voiced understanding and is in agreement with this. P.o. potassium ordered for hypokalemia, however patient reports that she cannot take p.o. potassium due to size of pill.  Will administer potassium packet to be dissolved in liquid.  Patient passed p.o. challenge without difficulty.  Return precautions discussed, she is appropriate discharge at this time.    Amount and/or Complexity of Data Reviewed Labs: ordered.  Risk Prescription drug management.        Final diagnoses:  Nausea  Hypokalemia  Elevated ALT measurement  Constipation, unspecified constipation type    ED Discharge Orders     None          Glendia Rocky LOISE DEVONNA 03/26/24 1129    Dreama Rocky, MD 03/27/24 1045

## 2024-03-26 NOTE — Discharge Instructions (Addendum)
 Your symptoms today are likely due to constipation, this can be caused by medications you are taking, including oxycodone  and Mounjaro.  Discussed these medications with your primary care provider.  You may also experience constipation due to dehydration, please increase your water intake, you should have a goal of 1.5L of water per day.  For constipation, start MiraLAX, 17 g daily, and docusate (colace) daily, this is a stool softener.  You were found to have mildly low potassium levels in the emergency department today, this was corrected with a potassium supplement before you were discharged.  You were also found to have a mild elevation in one of your liver enzymes, ALT, please discuss this with your primary care provider.  For nausea, continue small meals/snacks throughout the day to prevent onset of nausea.  The medications that you are on, specifically Mounjaro, may also contribute to worsening nausea.  Continue Zofran  as needed for nausea.  Return to the emergency department if your symptoms worsen.

## 2024-03-26 NOTE — ED Triage Notes (Signed)
 Lightheaded and dizzy with abd pain x 1 week. Eval at [facility] for same. Endorses nausea

## 2024-03-27 LAB — URINE CULTURE

## 2024-08-04 ENCOUNTER — Ambulatory Visit: Admitting: Podiatry

## 2024-08-04 DIAGNOSIS — B351 Tinea unguium: Secondary | ICD-10-CM

## 2024-08-04 NOTE — Progress Notes (Signed)
 "    Subjective:  Patient ID: Jean Henderson, female    DOB: [birth year],  MRN: [id number]  Jean Henderson presents to clinic today for fungal nail check.  She had finished the oral terbinafine  and feels that there has been some continued improvement.  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   Past Medical History:  Diagnosis Date   Bilateral plantar fasciitis 02/04/2021   Chronic, continuous use of opioids 07/08/2021   Last Assessment & Plan: Formatting of this note might be different from the original. Patient and I have discussed the hazardous effects of continued opiate pain medication usage. Risks and benefits of above medications including but not limited to possibility of hyperalgesia, respiratory depression, sedation, and even death were discussed with the patient who expressed an understanding. Patient i   Class 3 severe obesity due to excess calories with body mass index (BMI) of 45.0 to 49.9 in adult 07/08/2021   Last Assessment & Plan: Formatting of this note might be different from the original. Patient educated about the detrimental effects of weight as it specifically pertains to pain management and overall health.  Patient's BMI 45.93 Encouraged healthy eating habits and routine low-impact cardiovascular exercises as tolerated.   Diabetes mellitus without complication (HCC)    Dyspnea    with exersion   Elevated blood pressure reading 07/08/2021   Last Assessment & Plan: Formatting of this note might be different from the original. Blood pressure elevated in clinic today.  Discussed detrimental effects of uncontrolled blood pressure including heart attack, stroke, and possible kidney failure. Patient has had consistent elevated blood pressures over the last several visits with us .  She continues to work with her PCP for ongoing management.    Financial difficulty 12/02/2021   Last Assessment & Plan: Formatting of this note might be different from the original.  Recommend reaching back out to cares suite for assistance.  Telephone number provided.   Generalized anxiety disorder 12/08/2016   HNP (herniated nucleus pulposus), lumbar 10/22/2016   Hypertension    Ingrown nail 04/20/2017   Lumbar post-laminectomy syndrome 03/18/2021   Last Assessment & Plan: Formatting of this note might be different from the original. 49 year old female seen today in routine follow-up for chronic lower back pain with predominantly left lower extremity radicular pain.  Historically, she has complained of primarily right lower extremity radicular pain that improved following L5-S1 laminectomy.   Review of lumbar spine detailed in HPI.  Patient r   Lumbar radiculopathy 03/18/2021   Last Assessment & Plan: Formatting of this note might be different from the original. Continue gabapentin  as directed.   Major depressive disorder, recurrent episode, severe (HCC) 12/08/2016   Obesity    Pain medication agreement 03/18/2021   Last Assessment & Plan: Formatting of this note might be different from the original. Agreement up-to-date. UDS to be collected in compliance with clinic policies and procedures.  Previous UDS results reviewed. Patient did not display any signs of abuse or diversion and has been checked on the [place]  Controlled Substance website.   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 Discussed the detrimental effects of tobacco on bone and overall health.  Encourage smoking cessation.   Past  Surgical History:  Procedure Laterality Date   CESAREAN SECTION     x2   CHOLECYSTECTOMY     LUMBAR LAMINECTOMY/DECOMPRESSION MICRODISCECTOMY Right 10/22/2016   Procedure: Right Lumbar Five-Sacral One Microdiscectomy;   Surgeon: Arley Helling, MD;  Location: [facility] OR;  Service: Neurosurgery;  Laterality: Right;   Mirena insertion     Vaginal   TUBAL LIGATION     Allergies[1]  Review of Systems: Negative except as noted in the HPI.  Objective:  Vascular Examination: Capillary refill time is 3-5 seconds to toes bilateral. Palpable pedal pulses b/l LE. Digital hair present b/l.    Dermatological Examination: Pedal skin with normal turgor, texture and tone b/l. No open wounds. No interdigital macerations b/l.  The left hallux toenail is 3mm thick, discolored, dystrophic with subungual debris. There is pain with compression of the nail plates.  There is at least 20 to 25% proximal clearing at this time.  Assessment/Plan: 1. Fungal nail infection    The mycotic toenails were sharply debrided x10 with sterile nail nippers and a power debriding burr to decrease bulk/thickness and length.    Patient to continue with current treatment plan for the fungal nails as there is improvement noted.  Will check on her next visit and see if we need to do a 30-day pulsed dose of the oral terbinafine .     Jean Henderson, DPM, FACFAS [facility]     [address] [place], [place] [address]                Office [phone]  Fax [phone]     [1]  Allergies Allergen Reactions   Bee Pollen Swelling   Other Other (See Comments)    Ask   Latex Itching and Rash   "

## 2024-11-24 ENCOUNTER — Ambulatory Visit: Admitting: Podiatry
